# Patient Record
Sex: Male | Born: 1955 | Race: Black or African American | Hispanic: No | Marital: Married | Smoking: Never smoker
Health system: Southern US, Community
[De-identification: ages and names within clinical notes are randomized; demographics above are authoritative.]

## PROBLEM LIST (undated history)

## (undated) DIAGNOSIS — I483 Typical atrial flutter: Secondary | ICD-10-CM

## (undated) DIAGNOSIS — R55 Syncope and collapse: Secondary | ICD-10-CM

## (undated) DIAGNOSIS — Z950 Presence of cardiac pacemaker: Secondary | ICD-10-CM

## (undated) DIAGNOSIS — Q254 Congenital malformation of aorta unspecified: Secondary | ICD-10-CM

## (undated) DIAGNOSIS — I2699 Other pulmonary embolism without acute cor pulmonale: Secondary | ICD-10-CM

## (undated) DIAGNOSIS — I10 Essential (primary) hypertension: Secondary | ICD-10-CM

## (undated) DIAGNOSIS — C801 Malignant (primary) neoplasm, unspecified: Secondary | ICD-10-CM

## (undated) DIAGNOSIS — Z9289 Personal history of other medical treatment: Secondary | ICD-10-CM

## (undated) DIAGNOSIS — I442 Atrioventricular block, complete: Secondary | ICD-10-CM

## (undated) DIAGNOSIS — I440 Atrioventricular block, first degree: Secondary | ICD-10-CM

## (undated) HISTORY — DX: Syncope and collapse: R55

## (undated) HISTORY — DX: Personal history of other medical treatment: Z92.89

## (undated) HISTORY — DX: Typical atrial flutter: I48.3

## (undated) HISTORY — DX: Atrioventricular block, first degree: I44.0

---

## 2002-11-01 ENCOUNTER — Encounter: Payer: Self-pay | Admitting: Ophthalmology

## 2002-11-01 ENCOUNTER — Ambulatory Visit (HOSPITAL_COMMUNITY): Admission: RE | Admit: 2002-11-01 | Discharge: 2002-11-01 | Payer: Self-pay | Admitting: Ophthalmology

## 2007-12-26 ENCOUNTER — Inpatient Hospital Stay (HOSPITAL_COMMUNITY): Admission: EM | Admit: 2007-12-26 | Discharge: 2007-12-28 | Payer: Self-pay | Admitting: Emergency Medicine

## 2007-12-28 ENCOUNTER — Encounter (INDEPENDENT_AMBULATORY_CARE_PROVIDER_SITE_OTHER): Payer: Self-pay | Admitting: Cardiology

## 2008-01-25 ENCOUNTER — Ambulatory Visit: Payer: Self-pay | Admitting: Internal Medicine

## 2008-02-05 ENCOUNTER — Ambulatory Visit: Payer: Self-pay

## 2008-03-02 ENCOUNTER — Ambulatory Visit: Payer: Self-pay | Admitting: Internal Medicine

## 2008-03-02 LAB — CONVERTED CEMR LAB
BUN: 8 mg/dL (ref 6–23)
Basophils Absolute: 0 10*3/uL (ref 0.0–0.1)
Basophils Relative: 0.3 % (ref 0.0–1.0)
CO2: 32 meq/L (ref 19–32)
Calcium: 9 mg/dL (ref 8.4–10.5)
Chloride: 106 meq/L (ref 96–112)
Creatinine, Ser: 1.2 mg/dL (ref 0.4–1.5)
Eosinophils Absolute: 0 10*3/uL (ref 0.0–0.7)
Eosinophils Relative: 0.8 % (ref 0.0–5.0)
GFR calc Af Amer: 82 mL/min
GFR calc non Af Amer: 68 mL/min
Glucose, Bld: 101 mg/dL — ABNORMAL HIGH (ref 70–99)
HCT: 43.9 % (ref 39.0–52.0)
Hemoglobin: 14.2 g/dL (ref 13.0–17.0)
INR: 1.1 — ABNORMAL HIGH (ref 0.8–1.0)
Lymphocytes Relative: 56.8 % — ABNORMAL HIGH (ref 12.0–46.0)
MCHC: 32.4 g/dL (ref 30.0–36.0)
MCV: 90.9 fL (ref 78.0–100.0)
Monocytes Absolute: 0.3 10*3/uL (ref 0.1–1.0)
Monocytes Relative: 7.3 % (ref 3.0–12.0)
Neutro Abs: 1.4 10*3/uL (ref 1.4–7.7)
Neutrophils Relative %: 34.8 % — ABNORMAL LOW (ref 43.0–77.0)
Platelets: 300 10*3/uL (ref 150–400)
Potassium: 4.3 meq/L (ref 3.5–5.1)
Prothrombin Time: 13.2 s (ref 10.9–13.3)
RBC: 4.83 M/uL (ref 4.22–5.81)
RDW: 13.1 % (ref 11.5–14.6)
Sodium: 142 meq/L (ref 135–145)
WBC: 4.1 10*3/uL — ABNORMAL LOW (ref 4.5–10.5)
aPTT: 28.4 s (ref 21.7–29.8)

## 2008-03-11 ENCOUNTER — Ambulatory Visit: Payer: Self-pay | Admitting: Cardiology

## 2008-03-11 ENCOUNTER — Inpatient Hospital Stay (HOSPITAL_BASED_OUTPATIENT_CLINIC_OR_DEPARTMENT_OTHER): Admission: RE | Admit: 2008-03-11 | Discharge: 2008-03-11 | Payer: Self-pay | Admitting: Cardiology

## 2008-04-01 ENCOUNTER — Ambulatory Visit: Payer: Self-pay | Admitting: Internal Medicine

## 2009-07-14 ENCOUNTER — Emergency Department (HOSPITAL_COMMUNITY): Admission: EM | Admit: 2009-07-14 | Discharge: 2009-07-14 | Payer: Self-pay | Admitting: Emergency Medicine

## 2010-11-04 ENCOUNTER — Encounter: Payer: Self-pay | Admitting: Cardiovascular Disease

## 2011-02-26 NOTE — Letter (Signed)
January 25, 2008    Cameron Huerta, M.D.  [address]  [address]  [place], [place] [address]   RE:  Cameron Huerta, Cameron Huerta  MRN:  [id number]  /  DOB:  [birth year]   Dear Dr. Sharyn Huerta,   It was a pleasure to see your patient Cameron Huerta in consultation today  because of syncope.   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  He was at  [employer] with his wife, and he needed something from the cardiac, and he  sprinted to the car.  He.  He stopped at the car and became lightheaded  and then syncopal.  EMS was called.  They arrived about 20 minutes  later, as per the estimate of his wife.  Vital signs at that point were  not recalled.  He was taken by ambulance to [facility], where he was  found to be in atrial flutter.  He is unaware of palpitations.  I should  note that when he awakened on the ground he was a acutely aware of being  short of breath, although there were no palpitations.  The shortness of  breath had resolved by the time he got to the hospital.  The atrial  flutter resolve spontaneously at the time of micturition.   The patient had a previous episode of syncope about 10 years before.  The situation was quite similar.  He had sprinted across the track to  demonstrate to the track team how to run through.  He stopped, turned,  had the same prodrome of lightheadedness, and became syncopal.  He has  had no other episodes that he can recall.   Thromboembolic risk factors are negative.   FAMILY HISTORY:  Negative for syncope or sudden death.   PAST MEDICAL HISTORY:  Broadly negative.   PAST SURGICAL HISTORY:  Negative.   REVIEW OF SYSTEMS:  Also broadly negative.   SOCIAL HISTORY:  As above.  He does not use cigarettes, alcohol, or  recreational drugs.  He does exercise and would like to continue to do  that.   Currently, he is taking red yeast rice and multivitamins.   He is not  allergic to latex.  He does have some upset stomach with  SHELLFISH, but no hive reaction.   PHYSICAL EXAMINATION:  GENERAL:  He is a middle-aged [place] American  male appearing his stated age of 34.  VITAL SIGNS:  His blood pressure is 128/74, his weight was 248.  His  heart rate was 101.  He was in no acute distress.  HEENT:  Demonstrated no icterus or xanthomata.  NECK:  Veins were flat.  His carotids were brisk and full bilaterally,  without bruits.  BACK:  Without kyphosis or scoliosis.  LUNGS:  Clear.  HEART:  Sounds were regular, without murmurs or gallops.  ABDOMEN:  Soft, with active bowel sounds, without midline pulsation or  hepatomegaly.  EXTREMITIES:  Femoral pulses were 2+.  Distal pulses were intact.  There  was no clubbing, cyanosis, or edema.  NEUROLOGIC:  Grossly normal.   Electrocardiograms were somewhat variable.  Today's electrocardiogram  demonstrated sinus rhythm at 101, with intervals of 0.24/0.09/0.33.  The  axis was 185 degrees.  The P-wave morphology was a little bit unusual  and was a little bit prominent in the positive deflection in lead V1.  The P-wave duration was least 120  msec in lead II.   Electrocardiogram from presentation at [facility] demonstrated atrial  flutter, which was typical, with an axis of 157.  Electrocardiogram  obtained later that day demonstrated sinus rhythm, with intervals of  0.26/0.09/0.35.  The axis about 150 degrees there as well, and the  electrocardiogram on December 27, 2007 demonstrated an RSR prime in lead  V1, the first-degree AV block was still evident at 270 msec.   Echocardiogram read by Dr. Sharyn Huerta as normal, with normal right-sided  volumes.   IMPRESSION:  1. Recurrent syncope, post exertional, with an interlude of 10 years.  2. Atrial flutter, with spontaneous termination.  3. Thromboembolic risk factors are negative, with a CHADS score of 0.  4. Abnormal electrocardiogram manifested by:      a.     RSR  prime.      b.     First degree AV block.      c.     Right axis deviation.   DISCUSSION:  Please the transcription school back up and prior the  problems insert   Dr. Sharyn Huerta, [name] has an unusual constellation of abnormalities  that beg a unifying diagnosis.  First and foremost, his atrial flutter,  and I think notwithstanding his CHADS score of 0, the thromboembolic  risk is clearly not 0, and the likelihood of recurrence is felt to be  very high, probably in the range of 90+% over the next year, and I think  it is reasonable to consider catheter ablation of his atrial flutter  substrate.  They are people who would argue that a second episode is  worth awaiting.  As the patient has no palpitations with his arrhythmia,  I am not sure that that offers [place] a lot in terms of certainty of  recurrence.  We discussed catheter ablation, with potential benefits as  well as potential risks, compared to thromboembolic risk in the absence  of therapy.  He will consider that with his wife and decide.   The second issue relates to his syncope.  I suspect it is neurally  mediated in a post-exertional phase.  In the absence of his  electrocardiographic abnormalities, I would attribute this to a post-  exertional vasodilatory phase.  However, his electrocardiographic  abnormalities beg further evaluation.  The differential would include an  ASD which might contribute to his atrial flutter, but I would have  expected some atriopathy to be evident on echocardiogram, Brugada  syndrome as suggested by the first-degree AV block and incomplete right  bundle branch block.  To that end, I would repeat electrocardiogram with  V1 and V2 put in the second intercostal space, as well as give him a  sodium challenge with flecainide 300 mg and look at serial  electrocardiograms.   Prior to resuming workouts, I also think he should undergo stress  testing because the close relationship with exertion to his  syncopal  episodes suggests the possibility of a cardiomyopathic process giving  rise to syncope, although as I said above, the interlude of 10 years  makes that less likely.   Based on the above, which I reviewed extensively with the patient and  his wife, I recommend that:  1. We pursue Myoview scanning.  2. We consider catheter ablation.  3. We anticipate sodium channel challenge.  4. Which I did not review with them, but on further review I think it      is important to repeat his  echocardiogram with a bubble study to see if we can find evidence      of a primum ASD that might explain his electrocardiographic      abnormalities.   Thanks very much for the consultation.    Sincerely,      Duke Salvia, MD, [facility]  Electronically Signed    SCK/MedQ  DD: 01/25/2008  DT: 01/25/2008  Job #: [id number]   CC:    Scott A. Gerda Diss, MD

## 2011-02-26 NOTE — Discharge Summary (Signed)
NAME:  Nunley, Leanthony                  ACCOUNT NO.:  [id number]   MEDICAL RECORD NO.:  [id number]          PATIENT TYPE:  INP   LOCATION:  1430                         FACILITY:  [facility]   PHYSICIAN:  Mohan N. Sharyn Lull, M.D. DATE OF BIRTH:  May 30, 1956   DATE OF ADMISSION:  12/25/2007  DATE OF DISCHARGE:  12/28/2007                               DISCHARGE SUMMARY   ADMITTING DIAGNOSES:  1. Status post syncope.  2. Rule out cardiac arrhythmias, status post paroxysmal atrial      fibrillation.  3. Laceration of occipital region with small hematoma.  4. Morbid obesity.  5. Positive family history of coronary artery disease.   DISCHARGE DIAGNOSES:  1. Status post syncope.  2. Status post paroxysmal atrial fibrillation.  3. Resolving small hematoma with laceration of the occipital region.  4. Elevated blood pressure.  5. Glucose intolerance.  6. Hypercholesteremia.  7. Morbid obesity.  8. Positive family history of coronary artery disease.   DISCHARGE HOME MEDICATIONS:  1. Enteric-coated aspirin 325 mg one tablet daily.  2. Lopressor 25 mg half tablet twice daily.  3. Crestor 10 mg one tablet daily, which the patient refused to take.      We will discuss with his PMD before starting any medicines.  4. Keflex 500 mg one tablet every 8 hours for five days.   The patient will see his PMD for removal of staples in 1 week.   DIET:  Low-salt, low-cholesterol, 1800 calories ADA diet.  The patient  has been advised to avoid sweets.   FOLLOWUP:  Follow up with PMD and [facility] as per the  patient's wish in 1 week.   ACTIVITY:  Increase activities slowly.  Avoid any heavy exertion.   CONDITION ON DISCHARGE:  Stable.   BRIEF HISTORY AND HOSPITAL COURSE:  The patient is a 55 year old black  male with no significant past medical history except for family history  of coronary artery disease and morbid obesity.  He came to the ER via  EMS following syncopal episodes and sustaining  small laceration over the  occipital region associated with small hematoma.  The patient states  while running from the [employer] to parking lot car, he felt suddenly dizzy  and passed out for few seconds and hit his head.  Denies any palpitation  prior to fall, but felt lightheaded and dizzy prior to falling on back  of his head.  Denies any chest pain, nausea, vomiting, or diaphoresis  prior to falling.  Denies seizure activity.  Denies such episodes in the  past.  Denies any recent weight loss.  Denies any history of exertional  chest pain or exertional dyspnea.  Denies palpitation and  lightheadedness in the past.  Denies cough, fevers, or chills.  The  patient was noted to be in atrial fibrillation with controlled  ventricular response in the ER and then spontaneously converted to  normal sinus rhythm.   PAST MEDICAL HISTORY:  As above.   PAST SURGICAL HISTORY:  None.   ALLERGIES:  None.   MEDICATIONS:  None.   SOCIAL HISTORY:  Married and has five children.  No history of smoking  or alcohol abuse.  He works as a Financial risk analyst at [employer].   FAMILY HISTORY:  Father is alive.  He is 83.  He had myocardial  infarction x2.  Mother died of myocardial infarction at the age of 30.  One sister in good health.   PHYSICAL EXAMINATION:  GENERAL:  He was alert, awake, and oriented x3 in  no acute distress.  VITAL SIGNS:  Blood pressure was 117/76 and pulse was 76, regular.  HEENT:  Conjunctiva was pink.  Head:  There was small laceration in base  of scalp with staples.  NECK:  Supple.  No JVD.  No bruit.  LUNGS:  Clear to auscultation without rhonchi or rales.  CARDIOVASCULAR:  S1 and S2 was normal.  There was no S3 or S4 gallop.  ABDOMEN:  Soft.  Bowel sounds were present.  Nontender.  EXTREMITIES:  There is no clubbing, cyanosis, or edema.   LABORATORY DATA:  CT of the brain showed no evidence of bleed.  There  was a small occipital hematoma, and no  fracture.  Hemoglobin was 14.7,  hematocrit 44.2, and white count of 6.3.  Potassium was 4.2, BUN 10,  creatinine 1.23, and glucose was 103.  CPK-MBs were negative.  His  initial EKG showed left atrial fibrillation with controlled ventricular  response and left posterior fascicular block.  A repeat EKG showed sinus  rhythm with first degree AV block with right axis deviation.  His  cholesterol was 211, LDL of 152, and glucose was 108.  TSH was in normal  range 0.795.  Repeat fasting glucose was 101, hemoglobin A1c was 5.9,  potassium was 4.3, BUN 11, and creatinine 1.31.   BRIEF HOSPITAL COURSE:  The patient was admitted to telemetry unit.  The  patient did not have any episodes of atrial fibrillation during the  hospital stay or any cardiac arrhythmias.  The patient's blood pressure  and blood sugar remained slightly elevated, but the patient refused for  any medications.  The patient had 2D echo done today, which showed  mildly thickened left ventricular wall and upper limit of normal aortic  roots with good left ventricular systolic function.  Discussed at length  with the patient and his wife regarding Holter event monitor, a loop  recorder,  etc., and medications and diet.  He will discuss with his PMD, and will  follow up with [facility] as an outpatient.  The patient has  been advised to call the emergency medical services if he feels dizzy,  lightheaded, or palpitations.      Eduardo Osier. Sharyn Lull, M.D.  Electronically Signed     MNH/MEDQ  D:  12/28/2007  T:  12/28/2007  Job:  [id number]

## 2011-02-26 NOTE — H&P (Signed)
NAME:  Cameron Huerta, Cameron Huerta                  ACCOUNT NO.:  [id number]   MEDICAL RECORD NO.:  [id number]          PATIENT TYPE:  OIB   LOCATION:  1963                         FACILITY:  [facility]   PHYSICIAN:  Bruce R. Juanda Chance, MD, FACCDATE OF BIRTH:  [birth year]   DATE OF ADMISSION:  03/11/2008  DATE OF DISCHARGE:  03/11/2008                              HISTORY & PHYSICAL   PRIMARY CARDIOLOGIST:  Duke Salvia, MD, [facility]   PRIMARY CARE PHYSICIAN:  Eduardo Osier. Sharyn Lull, MD   HISTORY OF PRESENT ILLNESS:  This is a 55 year old African-American male  who is here for cardiac catheterization secondary to stress test that  was nondiagnostic.  The test was done secondary to a syncopal episode in  March for which he was hospitalized.  He saw Dr. Graciela Husbands as an outpatient  on followup secondary to this for workup for cardiac etiology for  syncope.  The patient subsequently had a stress Myoview completed which  was nondiagnostic.  The patient has been referred for an outpatient  cardiac catheterization which will be performed by Dr. Charlies Constable  today.  The patient did have a history induced onset atrial flutter  which was spontaneously terminated on his own and his EKG revealed  incomplete right bundle-branch block.  He had an echocardiogram prior to  this.  Stress test revealed an EF of 55%-60%.   The patient has had no further episodes of syncope, chest discomfort.  He has been very active walking and would like to start jogging in the  summer but is waiting for cardiac clearance to do so.   PAST MEDICAL HISTORY:  Negative for diabetes, hypertension,  hypothyroidism, or kidney disease.  The patient does have a known  history of contrast dye allergy.  The patient is not on any medications  prior to this admission with the exception of some red yeast rice and  multivitamins at home.   FAMILY HISTORY:  Negative for syncope or sudden death.   PAST SURGICAL HISTORY:  None.   SOCIAL HISTORY:  He is a  principal at a high school.  He does not smoke,  does not drink alcohol.  He exercises regularly.   CURRENT LABORATORY DATA:  Sodium 142, potassium 4.3, chloride 106, CO2  32, BUN 8, creatinine 1.2, glucose 101.  Hemoglobin 14.2, hematocrit  43.9, white blood cells 4.0, platelets 300.  PT 13.2, INR 1.1.   PHYSICAL EXAMINATION:  VITAL SIGNS:  Blood pressure 149/70, heart rate  97, respirations 20, temperature 97% on room air.  HEENT:  Head is normocephalic, atraumatic.  Eyes, PERRLA.  Mucous  membranes in mouth are pink and moist.  Tongue is midline.  NECK:  Supple without JVD or carotid bruits appreciated.  CARDIOVASCULAR:  Regular rate and rhythm with 1/6 systolic murmur  auscultated without rubs or gallops.  LUNGS:  Clear to auscultation without wheezes, rales, or rhonchi.  ABDOMEN:  Obese, nontender with 2+ bowel sounds.  EXTREMITIES:  Femoral pulses were 1+ bilaterally, radial pulses 1+  bilaterally, dorsalis pedis pulses 1+ bilaterally.  There is no edema  noted.  SKIN:  Warm and dry.  NEURO:  Intact.   IMPRESSION:  1. Syncopal episode, rule out cardiac etiology with nondiagnostic      stress test.  2. History of atrial flutter with spontaneous termination.   PLAN:  The patient will be admitted as an outpatient for cardiac  catheterization to be performed today per Dr. Charlies Constable, more  recommendations post procedure.  The patient has been given IV contrast  dye prophylaxis.  Risks and benefits have been explained to the patient  and also explanation of procedure.  He verbalizes understanding and is  willing to proceed.      Bettey Mare. Lyman Bishop, NP      Everardo Beals. Juanda Chance, MD, [facility]  Electronically Signed    KML/MEDQ  D:  03/11/2008  T:  03/12/2008  Job:  [id number]   cc:   Eduardo Osier. Sharyn Lull, M.D.

## 2011-02-26 NOTE — Cardiovascular Report (Signed)
NAME:  Cameron Huerta, Cameron Huerta                  ACCOUNT NO.:  [id number]   MEDICAL RECORD NO.:  [id number]          PATIENT TYPE:  OIB   LOCATION:  1963                         FACILITY:  [facility]   PHYSICIAN:  Bruce R. 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  He recently had a syncopal episode while running and  was seen in consultation by Dr. Graciela Husbands.  He had a stress test, which  suggested questionable apical ischemia and arrangements were made for  cardiac catheterization.  His ejection fraction was 53% on a stress  test.   PROCEDURE:  The procedure was performed via the right femoral artery and  arterial sheath and 4-French Performa coronary catheters.  A front wall  arterial puncture was performed, and Omnipaque contrast was used.  The  patient tolerated the procedure well and left the operating room in  satisfactory condition.   RESULTS:  The aortic pressure was 126/82 with a mean of 101, left  ventricular pressure was 126/15.  The left main coronary artery present in the left main coronary artery  was free of significant disease.  Left anterior descending artery present in the left anterior descending  artery gave rise 2 diagonal branch and 2 septal perforators.  These in  the LAD proper were free of significant disease.  The circumflex artery present in the circumflex artery gave rise to the  marginal branch and posterolateral branches.  These vessels were free of  significant disease.  The right coronary artery present in the right coronary artery was with  a moderate-sized vessel gave rise to a posterior descending branch and 2  small posterolateral branches.  These vessels were free of significant  disease.  The left ventriculogram present in the left ventriculogram was  performed  on RAO projection showed good wall motion with no areas of hypokinesis  with an ejection fraction of 60%.   CONCLUSION:  Normal coronary angiography in left ventricular motion.   RECOMMENDATION:  Reassurance.  Will have the patient see Dr. Graciela Husbands back  in followup for __________ further evaluation.      Bruce Elvera Lennox Juanda Chance, MD, [facility]  Electronically Signed     BRB/MEDQ  D:  03/11/2008  T:  03/12/2008  Job:  [id number]   cc:   Duke Salvia, MD, [facility]  Scott A. Gerda Diss, MD  Eduardo Osier Sharyn Lull, M.D.

## 2011-02-26 NOTE — Assessment & Plan Note (Signed)
[facility]                         ELECTROPHYSIOLOGY OFFICE NOTE   NAME:Cameron Huerta, Cameron Huerta                    MRN:          [id number]  DATE:04/01/2008                            DOB:          18-Jun-1956    Cameron Huerta is seen following the catheterization that was prompted by an  abnormal Myoview scan in the setting of syncope.  His catheterization  was normal.   He also had an RSR prime, a little bit of ST-elevation in the anterior  precordium, and the exclusion of Brugada is something that we have also  discussed.  Furthermore, when he presented to hospital in March, he was  in atrial flutter.  His [place] score is 0.   His current medications include only red yeast rice.  His blood pressure  was mildly elevated at 142/91, with the pulse of 85.  He says his blood  pressure is elevated because he hates coming here, although I should  note that his blood pressure when he was here last was 128/74.  His  lungs were clear.  His neck veins were flat.  His heart sounds were  regular.  The abdomen was Soft.  The extremities were without edema.   Electrocardiogram today demonstrated sinus rhythm at 80 with a rightward  axis, which is a little bit more rightward than it has been about 213.  The PR interval is 0.26, which is stable and this remains an R prime in  lead V1, though it is less evident than it was previously.   IMPRESSION:  1. Syncope, thought to be neurally mediated.  2. RSR prime in lead V1 with need to exclude Brugada syndrome.  3. Atrial flutter - typical.  4. [place] score 0.  5. Abnormal Myoview prompting catheterization, which was normal.  6. First-degree anteroventral block.    Cameron Huerta and his wife and I had a lengthy discussion regarding the  aforementioned issues.  The plan will be to proceed with a flutter  ablation for the potential reduction and thromboembolic risk associated  with a [place] score 0, which is estimated at 1.8%.  We  discussed potential  benefits as well as potential risks of the procedure including but not  limited to death and pacemaker implantation.  He would like to proceed.   In addition, at the time of that hospitalization, we will give him a  flecainide challenge to exclude as best as we can Brugada syndrome,  given the abnormal electrocardiogram.   The fact that he has conduction system disease adds to my concern about  Brugada syndrome not withstanding the episode.   He will let us know what works best for his schedule.     Duke Salvia, MD, [facility]  Electronically Signed    SCK/MedQ  DD: 04/01/2008  DT: 04/01/2008  Job #: [id number]   cc:   Dr.  Gerda Diss

## 2011-07-08 LAB — CARDIAC PANEL(CRET KIN+CKTOT+MB+TROPI)
CK, MB: 0.6
CK, MB: 0.9
Troponin I: 0.01

## 2011-07-08 LAB — COMPREHENSIVE METABOLIC PANEL
ALT: 27
AST: 26
Alkaline Phosphatase: 46
CO2: 30
Chloride: 104
GFR calc Af Amer: >60
GFR calc non Af Amer: >60
Glucose, Bld: 117 — ABNORMAL HIGH
Potassium: 4.6
Sodium: 139

## 2011-07-08 LAB — BASIC METABOLIC PANEL
BUN: 10
BUN: 8
BUN: 11
CO2: 31
CO2: 31
Calcium: 9.3
Chloride: 101
Chloride: 102
Chloride: 103
Creatinine, Ser: 1.21
Creatinine, Ser: 1.23
Creatinine, Ser: 1.31
GFR calc Af Amer: >60
Glucose, Bld: 108 — ABNORMAL HIGH
Glucose, Bld: 101 — ABNORMAL HIGH
Potassium: 4.2
Potassium: 4.3

## 2011-07-08 LAB — CBC
HCT: 40.3
HCT: 42.8
MCHC: 33.3
MCHC: 33.7
MCV: 87.6
MCV: 87.7
MCV: 87.7
Platelets: 239
Platelets: 251
Platelets: 246
RBC: 5.05
RDW: 13.8
RDW: 14
RDW: 14
WBC: 6.8
WBC: 5.5

## 2011-07-08 LAB — POCT CARDIAC MARKERS
Myoglobin, poc: 67
Operator id: 1192

## 2011-07-08 LAB — URINALYSIS, ROUTINE W REFLEX MICROSCOPIC
Bilirubin Urine: NEGATIVE
Ketones, ur: NEGATIVE
Nitrite: POSITIVE — AB
Specific Gravity, Urine: 1.021
Urobilinogen, UA: 0.2
pH: 6

## 2011-07-08 LAB — DIFFERENTIAL
Basophils Absolute: 0
Basophils Relative: 0
Eosinophils Absolute: 0.1
Neutro Abs: 3.1
Neutrophils Relative %: 50

## 2011-07-08 LAB — PROTIME-INR
INR: 1
Prothrombin Time: 13.5

## 2011-07-08 LAB — LIPID PANEL
Cholesterol: 211 — ABNORMAL HIGH
LDL Cholesterol: 152 — ABNORMAL HIGH
Total CHOL/HDL Ratio: 5.4
Triglycerides: 98
VLDL: 20

## 2011-07-08 LAB — APTT: aPTT: 26

## 2011-07-08 LAB — HEMOGLOBIN A1C: Hgb A1c MFr Bld: 5.9

## 2011-07-08 LAB — TROPONIN I: Troponin I: 0.01

## 2012-06-12 ENCOUNTER — Encounter: Payer: Self-pay | Admitting: Cardiology

## 2012-06-12 ENCOUNTER — Ambulatory Visit (INDEPENDENT_AMBULATORY_CARE_PROVIDER_SITE_OTHER): Payer: BC Managed Care – PPO | Admitting: Internal Medicine

## 2012-06-12 ENCOUNTER — Encounter: Payer: Self-pay | Admitting: Internal Medicine

## 2012-06-12 VITALS — BP 148/97 | HR 85 | Ht 74.0 in | Wt 251.8 lb

## 2012-06-12 DIAGNOSIS — E785 Hyperlipidemia, unspecified: Secondary | ICD-10-CM

## 2012-06-12 DIAGNOSIS — I4892 Unspecified atrial flutter: Secondary | ICD-10-CM

## 2012-06-12 NOTE — Progress Notes (Signed)
Patient has no care team.   HPI  Cameron Huerta is a 56 y.o. male Seen after a hiatus of a number of years because of lack of followup.  He was seen and found to have atrial flutter; he converted spontaneously. He has had no recurrent atrial flutter. Catheter ablation had been recommended in 2009 to decrease associated thromboembolic risk in the context of his CHADS2 score of 0  .    He has had no recurrent arrhythmia. He's had no recurrent syncope. The 2 episodes of syncope that he had were associated with brief running and stopping.  His CHADS-VASc score is 0 Past Medical History  Diagnosis Date  . Syncope   . Typical atrial flutter     [place] score - 0  . History of nuclear stress test     Abnormal  . First degree AV block   . Abnormal EKG     RSR prime in lead V1 with need to exclude Brugada Syndrome    No past surgical history on file.  Current Outpatient Prescriptions  Medication Sig Dispense Refill  . Multiple Vitamin (MULTI VITAMIN MENS PO) Take by mouth daily.        Not on File  Review of Systems negative except from HPI and PMH  Physical Exam BP 148/97  Pulse 85  Ht 6\' 2"  (1.88 m)  Wt 251 lb 12.8 oz (114.216 kg)  BMI 32.33 kg/m2 Alert and oriented in no acute distress HENT- normal Eyes- EOMI, without scleral icterus Skin- warm and dry; without rashes LN-neg Neck- supple without thyromegaly, JVP-flat, carotids brisk and full without bruits Back-without CVAT or kyphosis Lungs-clear to auscultation CV-Regular rate and rhythm, nl S1 and S2, no murmurs gallops or rubs, S4-absent Abd-soft with active bowel sounds; no midline pulsation or hepatomegaly Pulses-intact femoral and distal MKS-without gross deformity Neuro- Ax O, CN3-12 intact, grossly normal motor and sensory function Affect engaging  Electrocardiogram demonstrated sinus rhythm; is not available for review in epic  Assessment and  Plan

## 2012-06-14 DIAGNOSIS — I4892 Unspecified atrial flutter: Secondary | ICD-10-CM | POA: Insufficient documentation

## 2012-06-14 DIAGNOSIS — E785 Hyperlipidemia, unspecified: Secondary | ICD-10-CM | POA: Insufficient documentation

## 2012-06-14 NOTE — Assessment & Plan Note (Signed)
The patient has a Framingham risk score of a 10%. This puts him into the intermediate risk group for whom an LDL of 130-60 is appropriate for therapy depending on greater or less than 10%. I think for now it is reasonable to watch itan 10%

## 2012-06-14 NOTE — Assessment & Plan Note (Signed)
The patient has atrial flutter which has not recurred. His CHADS-VASc score is 0. Is not clear to me that any intervention is necessary. All catheter ablation could potentially eliminate that substrate of his atrial flutter marginal benefit at this point is measured in single digits per thousand and I have reviewed this with him. It was our agreed-upon decision to not do anything. We'll see him as needed for this appeared

## 2013-10-14 DIAGNOSIS — I2699 Other pulmonary embolism without acute cor pulmonale: Secondary | ICD-10-CM

## 2013-10-14 HISTORY — DX: Other pulmonary embolism without acute cor pulmonale: I26.99

## 2013-10-21 ENCOUNTER — Emergency Department (HOSPITAL_COMMUNITY): Payer: BC Managed Care – PPO

## 2013-10-21 ENCOUNTER — Inpatient Hospital Stay (HOSPITAL_COMMUNITY)
Admission: EM | Admit: 2013-10-21 | Discharge: 2013-10-25 | DRG: 176 | Disposition: A | Discharge status: Home / Self Care | Payer: BC Managed Care – PPO | Attending: Pulmonary Disease | Admitting: Pulmonary Disease

## 2013-10-21 ENCOUNTER — Encounter (HOSPITAL_COMMUNITY): Payer: Self-pay | Admitting: Emergency Medicine

## 2013-10-21 DIAGNOSIS — R55 Syncope and collapse: Secondary | ICD-10-CM | POA: Diagnosis not present

## 2013-10-21 DIAGNOSIS — I440 Atrioventricular block, first degree: Secondary | ICD-10-CM | POA: Diagnosis present

## 2013-10-21 DIAGNOSIS — Z8249 Family history of ischemic heart disease and other diseases of the circulatory system: Secondary | ICD-10-CM

## 2013-10-21 DIAGNOSIS — E785 Hyperlipidemia, unspecified: Secondary | ICD-10-CM

## 2013-10-21 DIAGNOSIS — R0902 Hypoxemia: Secondary | ICD-10-CM | POA: Diagnosis not present

## 2013-10-21 DIAGNOSIS — I498 Other specified cardiac arrhythmias: Secondary | ICD-10-CM | POA: Diagnosis present

## 2013-10-21 DIAGNOSIS — I4892 Unspecified atrial flutter: Secondary | ICD-10-CM | POA: Diagnosis present

## 2013-10-21 DIAGNOSIS — I441 Atrioventricular block, second degree: Secondary | ICD-10-CM

## 2013-10-21 DIAGNOSIS — I2699 Other pulmonary embolism without acute cor pulmonale: Principal | ICD-10-CM | POA: Diagnosis present

## 2013-10-21 DIAGNOSIS — R001 Bradycardia, unspecified: Secondary | ICD-10-CM

## 2013-10-21 HISTORY — DX: Other pulmonary embolism without acute cor pulmonale: I26.99

## 2013-10-21 LAB — BASIC METABOLIC PANEL
BUN: 14 mg/dL (ref 6–23)
CHLORIDE: 102 meq/L (ref 96–112)
CO2: 27 mEq/L (ref 19–32)
Calcium: 9.3 mg/dL (ref 8.4–10.5)
Creatinine, Ser: 1.15 mg/dL (ref 0.50–1.35)
GFR calc non Af Amer: 69 mL/min — ABNORMAL LOW (ref >90)
GFR, EST AFRICAN AMERICAN: 80 mL/min — AB (ref >90)
GLUCOSE: 101 mg/dL — AB (ref 70–99)
POTASSIUM: 4.7 meq/L (ref 3.7–5.3)
Sodium: 142 mEq/L (ref 137–147)

## 2013-10-21 LAB — POCT I-STAT 3, ART BLOOD GAS (G3+)
Bicarbonate: 25.3 mEq/L — ABNORMAL HIGH (ref 20.0–24.0)
O2 SAT: 95 %
PO2 ART: 76 mmHg — AB (ref 80.0–100.0)
TCO2: 26 mmol/L (ref 0–100)
pCO2 arterial: 40.7 mmHg (ref 35.0–45.0)
pH, Arterial: 7.401 (ref 7.350–7.450)

## 2013-10-21 LAB — HOMOCYSTEINE: HOMOCYSTEINE-NORM: 9.6 umol/L (ref 4.0–15.4)

## 2013-10-21 LAB — URINALYSIS, ROUTINE W REFLEX MICROSCOPIC
Bilirubin Urine: NEGATIVE
GLUCOSE, UA: NEGATIVE mg/dL
KETONES UR: 15 mg/dL — AB
Leukocytes, UA: NEGATIVE
NITRITE: NEGATIVE
PH: 7.5 (ref 5.0–8.0)
PROTEIN: NEGATIVE mg/dL
Specific Gravity, Urine: 1.015 (ref 1.005–1.030)
Urobilinogen, UA: 1 mg/dL (ref 0.0–1.0)

## 2013-10-21 LAB — COMPREHENSIVE METABOLIC PANEL
ALK PHOS: 61 U/L (ref 39–117)
ALT: 16 U/L (ref 0–53)
AST: 17 U/L (ref 0–37)
Albumin: 3.3 g/dL — ABNORMAL LOW (ref 3.5–5.2)
BUN: 11 mg/dL (ref 6–23)
CO2: 27 mEq/L (ref 19–32)
Calcium: 8.3 mg/dL — ABNORMAL LOW (ref 8.4–10.5)
Chloride: 102 mEq/L (ref 96–112)
Creatinine, Ser: 1.14 mg/dL (ref 0.50–1.35)
GFR calc non Af Amer: 70 mL/min — ABNORMAL LOW (ref >90)
GFR, EST AFRICAN AMERICAN: 81 mL/min — AB (ref >90)
GLUCOSE: 106 mg/dL — AB (ref 70–99)
POTASSIUM: 4.3 meq/L (ref 3.7–5.3)
Sodium: 140 mEq/L (ref 137–147)
TOTAL PROTEIN: 6.8 g/dL (ref 6.0–8.3)
Total Bilirubin: 0.9 mg/dL (ref 0.3–1.2)

## 2013-10-21 LAB — CBC WITH DIFFERENTIAL/PLATELET
BASOS PCT: 0 % (ref 0–1)
Basophils Absolute: 0 10*3/uL (ref 0.0–0.1)
Eosinophils Absolute: 0.1 10*3/uL (ref 0.0–0.7)
Eosinophils Relative: 1 % (ref 0–5)
HCT: 45.9 % (ref 39.0–52.0)
HEMOGLOBIN: 15.2 g/dL (ref 13.0–17.0)
LYMPHS ABS: 1.8 10*3/uL (ref 0.7–4.0)
Lymphocytes Relative: 32 % (ref 12–46)
MCH: 29 pg (ref 26.0–34.0)
MCHC: 33.1 g/dL (ref 30.0–36.0)
MCV: 87.4 fL (ref 78.0–100.0)
MONOS PCT: 8 % (ref 3–12)
Monocytes Absolute: 0.4 10*3/uL (ref 0.1–1.0)
NEUTROS ABS: 3.2 10*3/uL (ref 1.7–7.7)
NEUTROS PCT: 59 % (ref 43–77)
Platelets: 204 10*3/uL (ref 150–400)
RBC: 5.25 MIL/uL (ref 4.22–5.81)
RDW: 13.4 % (ref 11.5–15.5)
WBC: 5.5 10*3/uL (ref 4.0–10.5)

## 2013-10-21 LAB — URINE MICROSCOPIC-ADD ON

## 2013-10-21 LAB — CBC
HEMATOCRIT: 44.9 % (ref 39.0–52.0)
HEMOGLOBIN: 15.2 g/dL (ref 13.0–17.0)
MCH: 30 pg (ref 26.0–34.0)
MCHC: 33.9 g/dL (ref 30.0–36.0)
MCV: 88.7 fL (ref 78.0–100.0)
Platelets: 165 10*3/uL (ref 150–400)
RBC: 5.06 MIL/uL (ref 4.22–5.81)
RDW: 13.7 % (ref 11.5–15.5)
WBC: 11 10*3/uL — ABNORMAL HIGH (ref 4.0–10.5)

## 2013-10-21 LAB — TROPONIN I
Troponin I: <0.3 ng/mL (ref <0.30)
Troponin I: <0.3 ng/mL (ref <0.30)

## 2013-10-21 LAB — PROTIME-INR
INR: 1.71 — ABNORMAL HIGH (ref 0.00–1.49)
Prothrombin Time: 19.6 seconds — ABNORMAL HIGH (ref 11.6–15.2)

## 2013-10-21 LAB — MAGNESIUM: MAGNESIUM: 1.6 mg/dL (ref 1.5–2.5)

## 2013-10-21 LAB — ANTITHROMBIN III: AntiThromb III Func: 86 % (ref 75–120)

## 2013-10-21 LAB — PRO B NATRIURETIC PEPTIDE: Pro B Natriuretic peptide (BNP): 155.5 pg/mL — ABNORMAL HIGH (ref 0–125)

## 2013-10-21 LAB — MRSA PCR SCREENING: MRSA by PCR: NEGATIVE

## 2013-10-21 LAB — D-DIMER, QUANTITATIVE (NOT AT ARMC): D DIMER QUANT: 7.3 ug{FEU}/mL — AB (ref 0.00–0.48)

## 2013-10-21 LAB — GLUCOSE, CAPILLARY: Glucose-Capillary: 80 mg/dL (ref 70–99)

## 2013-10-21 LAB — PHOSPHORUS: Phosphorus: 3.3 mg/dL (ref 2.3–4.6)

## 2013-10-21 LAB — APTT: aPTT: 73 seconds — ABNORMAL HIGH (ref 24–37)

## 2013-10-21 MED ORDER — MORPHINE SULFATE 2 MG/ML IJ SOLN: 2.0000 mg | INTRAMUSCULAR | Status: DC | PRN

## 2013-10-21 MED ORDER — HEPARIN BOLUS VIA INFUSION
6000.0000 [IU] | Freq: Once | INTRAVENOUS | Status: AC
  Administered 2013-10-21: 6000 [IU] via INTRAVENOUS
  Filled 2013-10-21: qty 6000

## 2013-10-21 MED ORDER — SODIUM CHLORIDE 0.9 % IV SOLN: 250.0000 mL | INTRAVENOUS | Status: DC | PRN

## 2013-10-21 MED ORDER — ATROPINE SULFATE 0.1 MG/ML IJ SOLN
1.0000 mg | Freq: Once | INTRAMUSCULAR | Status: AC
  Administered 2013-10-21: 1 mg via INTRAVENOUS

## 2013-10-21 MED ORDER — ASPIRIN 81 MG PO CHEW
324.0000 mg | CHEWABLE_TABLET | Freq: Once | ORAL | Status: AC
  Administered 2013-10-21: 324 mg via ORAL
  Filled 2013-10-21: qty 4

## 2013-10-21 MED ORDER — OXYCODONE HCL 5 MG PO TABS: 5.0000 mg | ORAL_TABLET | ORAL | Status: DC | PRN

## 2013-10-21 MED ORDER — ASPIRIN 325 MG PO TABS: 325.0000 mg | ORAL_TABLET | Freq: Once | ORAL | Status: DC

## 2013-10-21 MED ORDER — ATROPINE SULFATE 1 MG/ML IJ SOLN: 1.0000 mg | Freq: Once | INTRAMUSCULAR | Status: DC

## 2013-10-21 MED ORDER — TRAMADOL HCL 50 MG PO TABS
50.0000 mg | ORAL_TABLET | Freq: Four times a day (QID) | ORAL | Status: DC | PRN
  Administered 2013-10-23 – 2013-10-24 (×2): 50 mg via ORAL
  Filled 2013-10-21 (×2): qty 1

## 2013-10-21 MED ORDER — SODIUM CHLORIDE 0.9 % IV SOLN
250.0000 mL | Freq: Once | INTRAVENOUS | Status: AC
  Administered 2013-10-21: 250 mL via INTRAVENOUS

## 2013-10-21 MED ORDER — SODIUM CHLORIDE 0.9 % IV BOLUS (SEPSIS): 2000.0000 mL | Freq: Once | INTRAVENOUS | Status: DC

## 2013-10-21 MED ORDER — HEPARIN (PORCINE) IN NACL 100-0.45 UNIT/ML-% IJ SOLN
1450.0000 [IU]/h | INTRAMUSCULAR | Status: DC
  Administered 2013-10-21: 1450 [IU]/h via INTRAVENOUS
  Filled 2013-10-21 (×4): qty 250

## 2013-10-21 MED ORDER — HEPARIN (PORCINE) IN NACL 100-0.45 UNIT/ML-% IJ SOLN
1600.0000 [IU]/h | INTRAMUSCULAR | Status: DC
  Administered 2013-10-21: 1600 [IU]/h via INTRAVENOUS
  Filled 2013-10-21 (×2): qty 250

## 2013-10-21 MED ORDER — SODIUM CHLORIDE 0.9 % IJ SOLN
3.0000 mL | Freq: Two times a day (BID) | INTRAMUSCULAR | Status: DC
  Administered 2013-10-21 – 2013-10-22 (×2): 3 mL via INTRAVENOUS

## 2013-10-21 MED ORDER — IOHEXOL 350 MG/ML SOLN
100.0000 mL | Freq: Once | INTRAVENOUS | Status: AC | PRN
  Administered 2013-10-21: 100 mL via INTRAVENOUS

## 2013-10-21 MED ORDER — MORPHINE SULFATE 4 MG/ML IJ SOLN
4.0000 mg | Freq: Once | INTRAMUSCULAR | Status: AC
  Administered 2013-10-21: 4 mg via INTRAVENOUS
  Filled 2013-10-21: qty 1

## 2013-10-21 MED ORDER — ASPIRIN 300 MG RE SUPP
300.0000 mg | RECTAL | Status: AC
  Filled 2013-10-21: qty 1

## 2013-10-21 MED ORDER — ONDANSETRON HCL 4 MG/2ML IJ SOLN: 4.0000 mg | Freq: Four times a day (QID) | INTRAMUSCULAR | Status: DC | PRN

## 2013-10-21 MED ORDER — ASPIRIN 81 MG PO CHEW
324.0000 mg | CHEWABLE_TABLET | ORAL | Status: AC
  Administered 2013-10-21: 324 mg via ORAL
  Filled 2013-10-21: qty 4

## 2013-10-21 MED ORDER — ALTEPLASE (PULMONARY EMBOLISM) INFUSION
100.0000 mg | Freq: Once | INTRAVENOUS | Status: AC
  Administered 2013-10-21: 100 mg via INTRAVENOUS
  Filled 2013-10-21: qty 100

## 2013-10-21 MED ORDER — ONDANSETRON HCL 4 MG/2ML IJ SOLN
4.0000 mg | Freq: Once | INTRAMUSCULAR | Status: AC
  Administered 2013-10-21: 4 mg via INTRAVENOUS

## 2013-10-21 MED ORDER — ONDANSETRON HCL 4 MG PO TABS: 4.0000 mg | ORAL_TABLET | Freq: Four times a day (QID) | ORAL | Status: DC | PRN

## 2013-10-21 MED ORDER — SODIUM CHLORIDE 0.9 % IV SOLN
INTRAVENOUS | Status: DC
  Administered 2013-10-21: 18:00:00 via INTRAVENOUS

## 2013-10-21 NOTE — ED Notes (Signed)
PT placed on 100% non rebreather. Because O2 sats in 80"s . Pt moved to [facility].

## 2013-10-21 NOTE — Progress Notes (Signed)
Requested by nursing secretary for pt who was coding in ED with family outside room. Chaplain presented to family in ED, but family member said she "is fine and does not need a chaplain." Please page if spiritual support is requested or needed at a later time.   Ful[place], [place]

## 2013-10-21 NOTE — ED Notes (Signed)
Pt c/o L sided chest pain that increases with inspiration x 3 days and chills/headache since last night.

## 2013-10-21 NOTE — ED Notes (Signed)
Patient transported to CT 

## 2013-10-21 NOTE — Progress Notes (Signed)
ANTICOAGULATION CONSULT NOTE - Initial Consult  Pharmacy Consult for Heparin Indication: pulmonary embolus  No Known Allergies  Patient Measurements: Height: 6\' 2"  (188 cm) Weight: 240 lb (108.863 kg) IBW/kg (Calculated) : 82.2 Heparin Dosing Weight: 104 kg  Vital Signs: Temp: 98.7 F (37.1 C) (01/08 0726) Temp src: Oral (01/08 0726) BP: 115/77 mmHg (01/08 0900) Pulse Rate: 74 (01/08 0900)  Labs:  Recent Labs  10/21/13 0800  HGB 15.2  HCT 45.9  PLT 204  CREATININE 1.15  TROPONINI <0.30    Estimated Creatinine Clearance: 93.1 ml/min (by C-G formula based on Cr of 1.15).   Medical History: Past Medical History  Diagnosis Date  . Syncope   . Typical atrial flutter     Mali score - 0  . History of nuclear stress test     Abnormal  . First degree AV block   . Abnormal EKG     RSR prime in lead V1 with need to exclude Brugada Syndrome    Medications:  PTA: MVI  Assessment: 58 y.o. male presents with CP. Found to have b/l PE on CT scan. To begin heparin. Baseline CBC stable.  1/8 CT scan: extensive bilateral acute pulmonary thromboembolism. Thereis nearly occlusive thrombus in the distal right pulmonary arteryextending into all 3 lobar vessels as well as segmental branches.There is also nonocclusive filling defect in the distal left pulmonary artery extending into left upper and lower lobe branches. No evidence of right heart strain based on the right ventricle to left ventricle ratio.  Goal of Therapy:  Heparin level 0.3-0.7 units/ml Monitor platelets by anticoagulation protocol: Yes   Plan:  1. Heparin IV bolus 6000 units 2. Heparin gtt at 1600 units/hr 3. Will f/u 6 hour heparin level 4. Daily heparin level and CBC  Sherlon Handing, PharmD, BCPS Clinical pharmacist, pager [phone] 10/21/2013,9:53 AM

## 2013-10-21 NOTE — ED Notes (Signed)
Pt wife calling for help in rm-- upon arrival into room-- pt with profuse diaphoresis, pale, heart rate in 40s. Dr. Tawnya Crook notified.

## 2013-10-21 NOTE — H&P (Signed)
Name: Cameron Huerta MRN: [id number] DOB: March 28, 1956    ADMISSION DATE:  10/21/2013 CONSULTATION DATE: 10/21/13    REFERRING MD :  [facility]  PRIMARY SERVICE: PCCM  CHIEF COMPLAINT:  sob  BRIEF PATIENT DESCRIPTION:  58 yo admitted with massive bilateral PE, given heparin gtt in ED, had subsequent bradycardia requiring atropine, Hep gtt held and tPA protocol initiated, did not require intubation  SIGNIFICANT EVENTS / STUDIES:  1/8 CTA with bilateral PE --> Hep gtt 1/8 bradycardia requiring Atropine 1/8 tPA protocol  LINES / TUBES: PIV  CULTURES: 1/8 UCx  ANTIBIOTICS: None  HISTORY OF PRESENT ILLNESS:  58 yo male with hx significant for paroxysmal atrial flutter previous evaluated by Cardiology on no anticoagulation, who presented to ED with complaints of shortness of breath and sharp non-radiating left sided chest pain on deep inspiration for prior 2-3 days. This was associated with productive cough, chills,low grade fever, bodyaches and headache. States wife had recent flu-like symptoms thus had attributed his condition to possible flu.  CXR unremarkable and EKG unchanged from prior.  D-dimer found to be elevated at 7.3 with subsequent CTA of chest demonstrating extensive bilateral pulmonary embolism with nearly occlusive thrombus in the  distal right pulmonary artery extending into all 3 lobar vessels as well as segmental branches. There was also nonocclusive filling defect in the distal left pulmonary artery extending into left upper and lower lobe branches.  He was started on IV Heparin with plan for admission given large amount of clot burden. Several hours later while still in ED pt's chest pain progressed from intermittent to constant on inspiration. He became diaphoretic,tachypneic, and bradycardic with heart rate in the 40's and O2 stas in the 80's. 1 amp atropine given with resolution of bradycardia.  He was placed on non-rebreather mask with improvement in oxygenation.  Of note, he  did vomit a large quantity of emesis. PCCM was consulted to assess for further management.    PAST MEDICAL HISTORY :  Past Medical History  Diagnosis Date  . Syncope   . Typical atrial flutter     Mali score - 0  . History of nuclear stress test     Abnormal  . First degree AV block   . Abnormal EKG     RSR prime in lead V1 with need to exclude Brugada Syndrome   History reviewed. No pertinent past surgical history. Prior to Admission medications   Medication Sig Start Date End Date Taking? Authorizing Provider  Multiple Vitamin (MULTI VITAMIN MENS PO) Take by mouth daily.   Yes Historical Provider, MD   Allergies  Allergen Reactions  . Scallops [Shellfish Allergy] Itching and Nausea And Vomiting    FAMILY HISTORY:  Family History  Problem Relation Age of Onset  . Hypertension Father   . Heart attack Father    SOCIAL HISTORY:  reports that he has never smoked. He has never used smokeless tobacco. He reports that he does not drink alcohol or use illicit drugs.  REVIEW OF SYSTEMS:  As per HPI otherwise negative   VITAL SIGNS: Temp:  [98.7 F (37.1 C)-99.8 F (37.7 C)] 99.8 F (37.7 C) (01/08 1715) Pulse Rate:  [44-96] 88 (01/08 1730) Resp:  [15-30] 24 (01/08 1730) BP: (57-141)/(33-88) 124/81 mmHg (01/08 1730) SpO2:  [92 %-100 %] 100 % (01/08 1730) FiO2 (%):  [100 %] 100 % (01/08 1529) Weight:  [240 lb (108.863 kg)] 240 lb (108.863 kg) (01/08 0726)    INTAKE / OUTPUT: Intake/Output  01/07 0701 - 01/08 0700 01/08 0701 - 01/09 0700   I.V. (mL/kg)  2000 (18.4)   Total Intake(mL/kg)  2000 (18.4)   Net   +2000          PHYSICAL EXAMINATION: General: Well-developed, well-nourished, on NRB; family at bedside Head: Normocephalic, atraumatic. Eyes: PERRLA, EOMI Lungs: Normal respiratory effort. Clear to auscultation bilaterally from apices to bases without crackles or wheezes appreciated. Heart: normal rate, regular rhythm, normal S1 and S2, no gallop, murmur,  or rubs appreciated. Abdomen: BS normoactive. Soft, Nondistended, non-tender. No masses or organomegaly appreciated. Extremities: No pretibial edema, distal pulses intact Neurologic: lethargic, otherwise grossly non-focal   LABS: CBC  Recent Labs Lab 10/21/13 0800  WBC 5.5  HGB 15.2  HCT 45.9  PLT 204   Coag's No results found for this basename: APTT, INR,  in the last 168 hours  BMET  Recent Labs Lab 10/21/13 0800  NA 142  K 4.7  CL 102  CO2 27  BUN 14  CREATININE 1.15  GLUCOSE 101*   Electrolytes  Recent Labs Lab 10/21/13 0800  CALCIUM 9.3   Sepsis Markers No results found for this basename: LATICACIDVEN, PROCALCITON, O2SATVEN,  in the last 168 hours ABG No results found for this basename: PHART, PCO2ART, PO2ART,  in the last 168 hours Liver Enzymes No results found for this basename: AST, ALT, ALKPHOS, BILITOT, ALBUMIN,  in the last 168 hours Cardiac Enzymes  Recent Labs Lab 10/21/13 0800  TROPONINI <0.30   Glucose No results found for this basename: GLUCAP,  in the last 168 hours  Imaging Dg Chest 2 View  10/21/2013   CLINICAL DATA:  Chest pain, chills  EXAM: CHEST  2 VIEW  COMPARISON:  12/25/2007  FINDINGS: The heart size and mediastinal contours are within normal limits. Both lungs are clear. The visualized skeletal structures are unremarkable.  IMPRESSION: No active cardiopulmonary disease.   Electronically Signed   By: Skipper Cliche M.D.   On: 10/21/2013 08:31   Ct Angio Chest Pe W/cm &/or Wo Cm  10/21/2013   CLINICAL DATA:  Chest pain  EXAM: CT ANGIOGRAPHY CHEST WITH CONTRAST  TECHNIQUE: Multidetector CT imaging of the chest was performed using the standard protocol during bolus administration of intravenous contrast. Multiplanar CT image reconstructions including MIPs were obtained to evaluate the vascular anatomy.  CONTRAST:  131mL OMNIPAQUE IOHEXOL 350 MG/ML SOLN  COMPARISON:  None.  FINDINGS: There is extensive bilateral acute pulmonary  thromboembolism. There is nearly occlusive thrombus in the distal right pulmonary artery extending into all 3 lobar vessels as well as segmental branches. There is also nonocclusive filling defect in the distal left pulmonary artery extending into left upper and lower lobe branches. No evidence of right heart strain based on the right ventricle to left ventricle ratio.  Extensive calcification in the left anterior descending coronary artery.  No pericardial effusion.  No abnormal adenopathy.  Ground-glass opacities are present at the base of the lingula.  Cholelithiasis.  Review of the MIP images confirms the above findings.  IMPRESSION: Acute pulmonary thromboembolism.  Cholelithiasis.  Critical Value/emergent results were called by telephone at the time of interpretation on 10/21/2013 at 9:39 AM to Dr. Jinny Blossom, [facility] , who verbally acknowledged these results.   Electronically Signed   By: Maryclare Bean M.D.   On: 10/21/2013 09:39     CXR:1/8 no acute findings  ASSESSMENT / PLAN:  PULMONARY A: massive bilateral pulmonary embolism now on tpa, transitioned from NRB, oxygenating 100%  on 2L nasal canula currently P:   Cont to monitor Cont tPA protocol  CARDIOVASCULAR A: h/o paroxysmal atrial flutter, no cardioversion, no anti-coagulation P:  Consider Cardiology consult monitor on tele Trend EKG Trend troponins  RENAL A:  No issues P:   Monitor BMET  GASTROINTESTINAL A: no issues P:   Start Protonix IV SUP  HEMATOLOGIC A:  massive PE s/p Hep gtt and tPA P:  tPA protocol initiated Cont to monitor  INFECTIOUS A: reports prior flu-like symptoms, no leukocytosis P:   Cont to monitor Check flu panel  ENDOCRINE A: no issues P:   Monitor   NEUROLOGIC A: lethargic but not encephalopathic P:   -cont to monitor   Family updated at length    Dorian Heckle, MD Internal Medicine Resident PGY 3  10/21/2013, 5:58 PM  Attending:  I have seen and examined the patient with nurse  practitioner/resident and agree with the note above.   CC time by me 49 minutes  Jillyn Hidden PCCM Pager: [phone] Cell: [phone] If no response, call [phone]

## 2013-10-21 NOTE — ED Notes (Signed)
Ordered lunch tray 

## 2013-10-21 NOTE — ED Notes (Addendum)
Report received, assumed care. Pt A&OX4. Denies SOB, worsening CP, nausea. POX 100% on NRB. VSS.

## 2013-10-21 NOTE — ED Notes (Signed)
Pt heart rate increasing-- not as diaphoretic, no further nausea.

## 2013-10-21 NOTE — ED Notes (Signed)
PCCM MD, Dr. Lake Bells at bedside.

## 2013-10-21 NOTE — Progress Notes (Signed)
[facility] for Heparin Indication: pulmonary embolus after receiving tPA  Allergies  Allergen Reactions  . Scallops [Shellfish Allergy] Itching and Nausea And Vomiting    Patient Measurements: Height: 6\' 2"  (188 cm) Weight: 240 lb (108.863 kg) IBW/kg (Calculated) : 82.2 Heparin Dosing Weight: 104 kg  Vital Signs: Temp: 98.6 F (37 C) (01/08 1807) Temp src: Oral (01/08 1807) BP: 146/89 mmHg (01/08 1800) Pulse Rate: 206 (01/08 1800)  Labs:  Recent Labs  10/21/13 0800 10/21/13 1900  HGB 15.2 15.2  HCT 45.9 44.9  PLT 204 165  APTT  --  73*  LABPROT  --  19.6*  INR  --  1.71*  CREATININE 1.15  --   TROPONINI <0.30  --     Estimated Creatinine Clearance: 93.1 ml/min (by C-G formula based on Cr of 1.15).   Assessment: 66 YOM who presented with CP and found to have B/L PE on CT scan. Started on heparin this morning. Early this afternoon, patient had syncope with near brady-arrest and required atropine. Heparin was held at 1500 this afternoon and tPA 100mg  IV over 2 hours was given starting at 1630. Labs 30 minutes post-tPA are as follows: INR = 1.71, aPTT = 73 seconds, Hgb = 15.2, Platelets 165. No bleeding is noted.  Goal of Therapy:  Heparin level 0.3-0.5 units/ml until 1/9 at 2100, after that the goal becomes 0.3-0.7 units/mL for the rest of heparin drip duration Monitor platelets by anticoagulation protocol: Yes   Plan:  1. Heparin can be resumed as aPTT is <80 seconds. Will resume heparin WITHOUT bolus at 1450 units/hr (14units/kg/hr) 2. Check heparin level in 6 hours- note goal above 3. Daily heparin level and CBC 4. Follow for long-term anticoagulant plans, s/s bleeding  Sakeena Teall D. Jonahtan Manseau, PharmD, BCPS Clinical Pharmacist Pager: [phone] 10/21/2013 8:13 PM

## 2013-10-21 NOTE — ED Notes (Signed)
C/o intermittent left lateral CP, prod cough x 2 days. Last night c/o chills, low grade fever, generalized body aches. Reports has noticed past 2 days gets SOB when he gets to top of stairs at home when he usually does not get SOB. No pedal edema. Reports deep breaths & laying on his left side makes pain worse. C/o pain only with deep breaths. Denies increase pain with exertion

## 2013-10-21 NOTE — ED Provider Notes (Addendum)
CSN: [id number]     Arrival date & time 10/21/13  3875 History   First MD Initiated Contact with Patient 10/21/13 [id number]     Chief Complaint  Patient presents with  . Chest Pain  . Chills   (Consider location/radiation/quality/duration/timing/severity/associated sxs/prior Treatment) Patient is a 58 y.o. male presenting with chest pain. The history is provided by the patient. No language interpreter was used.  Chest Pain Pain location:  L chest Pain quality: sharp   Pain radiates to:  Does not radiate Pain radiates to the back: no   Pain severity:  Moderate Onset quality:  Sudden Duration: 2-3 days. Timing:  Intermittent Progression:  Waxing and waning Chronicity:  New Context: breathing   Relieved by:  Nothing Worsened by:  Deep breathing Ineffective treatments:  None tried Associated symptoms: cough, fever and shortness of breath   Associated symptoms: no abdominal pain, no anorexia, no back pain, no dizziness, no dysphagia, no fatigue, no headache, no lower extremity edema, no nausea, no near-syncope, no numbness, no palpitations, no syncope, not vomiting and no weakness   Associated symptoms comment:  Chills, mylagias Cough:    Cough characteristics:  Productive   Sputum characteristics:  Brown   Severity:  Moderate   Cough duration: 2-3 days.   Timing:  Constant   Progression:  Unchanged   Chronicity:  New Fever:    Duration:  1 day   Temp source:  Subjective Risk factors: male sex   Risk factors: no aortic disease, no birth control, no coronary artery disease, no diabetes mellitus, no Ehlers-Danlos syndrome, no high cholesterol, no hypertension, no immobilization, no Marfan's syndrome, not obese, not pregnant, no prior DVT/PE, no smoking and no surgery     Past Medical History  Diagnosis Date  . Syncope   . Typical atrial flutter     Mali score - 0  . History of nuclear stress test     Abnormal  . First degree AV block   . Abnormal EKG     RSR prime in lead V1  with need to exclude Brugada Syndrome   History reviewed. No pertinent past surgical history. Family History  Problem Relation Age of Onset  . Hypertension Father   . Heart attack Father    History  Substance Use Topics  . Smoking status: Never Smoker   . Smokeless tobacco: Never Used  . Alcohol Use: No    Review of Systems  Constitutional: Positive for fever. Negative for activity change, appetite change and fatigue.  HENT: Negative for congestion, facial swelling, rhinorrhea and trouble swallowing.   Eyes: Negative for photophobia and pain.  Respiratory: Positive for cough and shortness of breath. Negative for chest tightness.   Cardiovascular: Positive for chest pain. Negative for palpitations, leg swelling, syncope and near-syncope.  Gastrointestinal: Negative for nausea, vomiting, abdominal pain, diarrhea, constipation and anorexia.  Endocrine: Negative for polydipsia and polyuria.  Genitourinary: Negative for dysuria, urgency, decreased urine volume and difficulty urinating.  Musculoskeletal: Negative for back pain and gait problem.  Skin: Negative for color change, rash and wound.  Allergic/Immunologic: Negative for immunocompromised state.  Neurological: Negative for dizziness, facial asymmetry, speech difficulty, weakness, numbness and headaches.  Psychiatric/Behavioral: Negative for confusion, decreased concentration and agitation.    Allergies  Scallops  Home Medications   Current Outpatient Rx  Name  Route  Sig  Dispense  Refill  . Multiple Vitamin (MULTI VITAMIN MENS PO)   Oral   Take by mouth daily.  BP 115/77  Pulse 74  Temp(Src) 98.7 F (37.1 C) (Oral)  Resp 21  Ht 6\' 2"  (1.88 m)  Wt 240 lb (108.863 kg)  BMI 30.80 kg/m2  SpO2 96% Physical Exam  Constitutional: He is oriented to person, place, and time. He appears well-developed and well-nourished. No distress.  HENT:  Head: Normocephalic and atraumatic.  Mouth/Throat: No oropharyngeal  exudate.  Eyes: Pupils are equal, round, and reactive to light.  Neck: Normal range of motion. Neck supple.  Cardiovascular: Normal rate, regular rhythm and normal heart sounds.  Exam reveals no gallop and no friction rub.   No murmur heard. Pulmonary/Chest: Effort normal and breath sounds normal. No respiratory distress. He has no wheezes. He has no rales.  Abdominal: Soft. Bowel sounds are normal. He exhibits no distension and no mass. There is no tenderness. There is no rebound and no guarding.  Musculoskeletal: Normal range of motion. He exhibits no edema and no tenderness.  Neurological: He is alert and oriented to person, place, and time.  Skin: Skin is warm and dry.  Psychiatric: He has a normal mood and affect.    ED Course  Procedures (including critical care time) Labs Review Labs Reviewed  BASIC METABOLIC PANEL - Abnormal; Notable for the following:    Glucose, Bld 101 (*)    GFR calc non Af Amer 69 (*)    GFR calc Af Amer 80 (*)    All other components within normal limits  D-DIMER, QUANTITATIVE - Abnormal; Notable for the following:    D-Dimer, Quant 7.30 (*)    All other components within normal limits  CBC WITH DIFFERENTIAL  TROPONIN I  HEPARIN LEVEL (UNFRACTIONATED)   Imaging Review Dg Chest 2 View  10/21/2013   CLINICAL DATA:  Chest pain, chills  EXAM: CHEST  2 VIEW  COMPARISON:  12/25/2007  FINDINGS: The heart size and mediastinal contours are within normal limits. Both lungs are clear. The visualized skeletal structures are unremarkable.  IMPRESSION: No active cardiopulmonary disease.   Electronically Signed   By: Skipper Cliche M.D.   On: 10/21/2013 08:31   Ct Angio Chest Pe W/cm &/or Wo Cm  10/21/2013   CLINICAL DATA:  Chest pain  EXAM: CT ANGIOGRAPHY CHEST WITH CONTRAST  TECHNIQUE: Multidetector CT imaging of the chest was performed using the standard protocol during bolus administration of intravenous contrast. Multiplanar CT image reconstructions including  MIPs were obtained to evaluate the vascular anatomy.  CONTRAST:  13mL OMNIPAQUE IOHEXOL 350 MG/ML SOLN  COMPARISON:  None.  FINDINGS: There is extensive bilateral acute pulmonary thromboembolism. There is nearly occlusive thrombus in the distal right pulmonary artery extending into all 3 lobar vessels as well as segmental branches. There is also nonocclusive filling defect in the distal left pulmonary artery extending into left upper and lower lobe branches. No evidence of right heart strain based on the right ventricle to left ventricle ratio.  Extensive calcification in the left anterior descending coronary artery.  No pericardial effusion.  No abnormal adenopathy.  Ground-glass opacities are present at the base of the lingula.  Cholelithiasis.  Review of the MIP images confirms the above findings.  IMPRESSION: Acute pulmonary thromboembolism.  Cholelithiasis.  Critical Value/emergent results were called by telephone at the time of interpretation on 10/21/2013 at 9:39 AM to Dr. Jinny Blossom, [facility] , who verbally acknowledged these results.   Electronically Signed   By: Maryclare Bean M.D.   On: 10/21/2013 09:39    EKG Interpretation    Date/Time:  Thursday October 21 2013 07:19:01 EST Ventricular Rate:  86 PR Interval:  272 QRS Duration: 78 QT Interval:  352 QTC Calculation: 421 R Axis:   -164 Text Interpretation:  Sinus rhythm with 1st degree A-V block Right superior axis deviation Abnormal ECG No significant change since last tracing Confirmed by [place] ([id number]) on 10/21/2013 7:31:12 AM          CRITICAL CARE Performed by: Ernestina Patches, E Total critical care time: 40 Critical care time was exclusive of separately billable procedures and treating other patients. Critical care was necessary to treat or prevent imminent or life-threatening deterioration. Critical care was time spent personally by me on the following activities: development of treatment plan with patient and/or surrogate  as well as nursing, discussions with consultants, evaluation of patient's response to treatment, examination of patient, obtaining history from patient or surrogate, ordering and performing treatments and interventions, ordering and review of laboratory studies, ordering and review of radiographic studies, pulse oximetry and re-evaluation of patient's condition.   MDM   1. Pulmonary embolism    Pt is a 58 y.o. male with Pmhx as above who presents with 2-3 days of inspiratory L sided CP, productive cough, also with subjective fever/chills since last night and spouse with recent influenza-like illness.  He also reports SOB w/ exertion.  No CP or SOB currently.  On PE, VSS, pt in NAD.  Cardiopulm exam benign.  No LE pain/edema. EKG unchanged from prior.  He had nml cath in 5/'09.  No hx or risk factors for ACS, or for PE other than age.  TIMI 0, low risk for MACE by HEART score.  CXR, CBC, BMP, Trop were unremarkable. D-dimer was elevated at 7.3.  CT PE study ordered and was + for extensive BL PE with nearly occlusive thrombus in the distal right pulmonary artery extending into all 3 lobar vessels as well as segmental branches. There is also nonocclusive filling defect in the distal left pulmonary artery extending into left upper and lower lobe branches.  No R heart strain.  Given large amt of clot burden, will admit to triad.  IV heparin ordered.   Neta Ehlers, MD 10/21/13 1044  3:15 PM Called to bedside. Pt diaphoretic, bradycardic in 40's, hypotensive as low as 56 systolic.  IV heparin temporarily halted to given IVF bolus while second IV placed, then 2nd L NS bolus started.  Pt began having emesis, continued to be bradycardic.  1mg  IV atropine given with improvement of BP & HR.  IV heparin restarted.  Pt remained to have nml mental status during episode.  States CP not worse than prior.  Denies SOB.  EKG repeated, was unchanged from prior.  Pt no longer appears to be appropriate floor candidate.   CCM consulted for admission and will see in ED.   3:35 PM Pt has now become hypoxic,  Requirement 15L NRB with O2 sats fluctuating greatly between 88-100.  Have recalled CCM as pt may now be good candidate for TPA given his worsening hemodynamics and hypoxia.  Critical care states Dr. Lake Bells will be there shortly to decide on lytics and admit pt to ICU.   Neta Ehlers, MD 10/22/13 1058

## 2013-10-21 NOTE — Progress Notes (Signed)
[facility] PCCM  Full note from resident to follow  Case reviewed, patient examined, CT angio images reviewed  Syncope, near brady-arrest earlier today requiring atropine  Given size of PE, HD instability, hypoxemia, we will move ahead with thrombolytics with TPA.  Contraindications reviewed with patient and wife, he has none.  Family informed of risks, benefits.    Plan TPA now per standard protocol; heparin on hold  Jillyn Hidden PCCM Pager: [phone] Cell: [phone] If no response, call [phone]

## 2013-10-21 NOTE — ED Notes (Signed)
Heart rate-- 40's-- Atropine 1 amp given per order.  Pt vomited large amt emesis.  Zofran 4mg  IV for nausea

## 2013-10-21 NOTE — ED Notes (Signed)
Patient transported to X-ray 

## 2013-10-21 NOTE — ED Notes (Signed)
Medicated for pain per order. States left lateral CP now with regular inspiration, therefore breathing shallow. Noted increased respiratory rate. States earlier CP occurred only with deep inspiration

## 2013-10-21 NOTE — Progress Notes (Signed)
58 year old man with prior h/o atrial flutter few years ago with spontaneous resolution, syncope, came in today for acute chest pain, he was worked up in ED , with a CT angio,w as found to have bilateral PE, was referred to [facility] for admission to medical service. Around 3 pm, pt became diaphoretic, went in to brady arrest requiring atropine. He was seen by PCCM, and transferred to PCCm service for further management .    Hosie Poisson, MD [phone]

## 2013-10-22 ENCOUNTER — Encounter (HOSPITAL_COMMUNITY): Payer: Self-pay | Admitting: Internal Medicine

## 2013-10-22 ENCOUNTER — Inpatient Hospital Stay (HOSPITAL_COMMUNITY): Payer: BC Managed Care – PPO

## 2013-10-22 DIAGNOSIS — I517 Cardiomegaly: Secondary | ICD-10-CM

## 2013-10-22 LAB — PROTEIN C, TOTAL: Protein C, Total: 73 % (ref 72–160)

## 2013-10-22 LAB — CBC
HCT: 39.3 % (ref 39.0–52.0)
HEMOGLOBIN: 13.2 g/dL (ref 13.0–17.0)
MCH: 29.8 pg (ref 26.0–34.0)
MCHC: 33.6 g/dL (ref 30.0–36.0)
MCV: 88.7 fL (ref 78.0–100.0)
Platelets: 158 10*3/uL (ref 150–400)
RBC: 4.43 MIL/uL (ref 4.22–5.81)
RDW: 13.6 % (ref 11.5–15.5)
WBC: 7.5 10*3/uL (ref 4.0–10.5)

## 2013-10-22 LAB — LUPUS ANTICOAGULANT PANEL
DRVVT: 36.4 secs (ref <42.9)
Lupus Anticoagulant: NOT DETECTED
PTT LA: 175.2 s — AB (ref 28.0–43.0)
PTTLA 4:1 Mix: 175.2 secs — ABNORMAL HIGH (ref 28.0–43.0)
PTTLA CONFIRMATION: 0.9 s (ref <8.0)

## 2013-10-22 LAB — CARDIOLIPIN ANTIBODIES, IGG, IGM, IGA
Anticardiolipin IgA: 10 APL U/mL — ABNORMAL LOW (ref <22)
Anticardiolipin IgG: 13 GPL U/mL (ref <23)
Anticardiolipin IgM: 4 MPL U/mL — ABNORMAL LOW (ref <11)

## 2013-10-22 LAB — BASIC METABOLIC PANEL
BUN: 12 mg/dL (ref 6–23)
CO2: 26 meq/L (ref 19–32)
CREATININE: 0.93 mg/dL (ref 0.50–1.35)
Calcium: 8.1 mg/dL — ABNORMAL LOW (ref 8.4–10.5)
Chloride: 103 mEq/L (ref 96–112)
GFR calc Af Amer: >90 mL/min (ref >90)
GFR calc non Af Amer: >90 mL/min (ref >90)
GLUCOSE: 99 mg/dL (ref 70–99)
Potassium: 4.1 mEq/L (ref 3.7–5.3)
SODIUM: 140 meq/L (ref 137–147)

## 2013-10-22 LAB — BETA-2-GLYCOPROTEIN I ABS, IGG/M/A
BETA-2-GLYCOPROTEIN I IGA: 8 A Units (ref <20)
BETA-2-GLYCOPROTEIN I IGM: 16 M Units (ref <20)
Beta-2 Glyco I IgG: 10 G Units (ref <20)

## 2013-10-22 LAB — FACTOR 5 LEIDEN

## 2013-10-22 LAB — PROTEIN S, TOTAL: PROTEIN S AG TOTAL: 66 % (ref 60–150)

## 2013-10-22 LAB — PROTEIN C ACTIVITY: PROTEIN C ACTIVITY: 119 % (ref 75–133)

## 2013-10-22 LAB — HEPARIN LEVEL (UNFRACTIONATED)
HEPARIN UNFRACTIONATED: 1.48 [IU]/mL — AB (ref 0.30–0.70)
HEPARIN UNFRACTIONATED: 0.5 [IU]/mL (ref 0.30–0.70)
Heparin Unfractionated: 0.41 IU/mL (ref 0.30–0.70)

## 2013-10-22 LAB — PROTHROMBIN GENE MUTATION

## 2013-10-22 LAB — PROTEIN S ACTIVITY: Protein S Activity: 82 % (ref 69–129)

## 2013-10-22 MED ORDER — PNEUMOCOCCAL VAC POLYVALENT 25 MCG/0.5ML IJ INJ
0.5000 mL | INJECTION | INTRAMUSCULAR | Status: AC
  Administered 2013-10-23: 0.5 mL via INTRAMUSCULAR
  Filled 2013-10-22: qty 0.5

## 2013-10-22 MED ORDER — RIVAROXABAN 20 MG PO TABS: 20.0000 mg | ORAL_TABLET | Freq: Every day | ORAL | Status: DC

## 2013-10-22 MED ORDER — RIVAROXABAN 15 MG PO TABS
15.0000 mg | ORAL_TABLET | Freq: Two times a day (BID) | ORAL | Status: DC
  Administered 2013-10-23 – 2013-10-25 (×5): 15 mg via ORAL
  Filled 2013-10-22 (×7): qty 1

## 2013-10-22 MED ORDER — PANTOPRAZOLE SODIUM 40 MG PO TBEC
40.0000 mg | DELAYED_RELEASE_TABLET | Freq: Every day | ORAL | Status: DC
  Administered 2013-10-22 – 2013-10-25 (×4): 40 mg via ORAL
  Filled 2013-10-22 (×4): qty 1

## 2013-10-22 MED ORDER — HEPARIN (PORCINE) IN NACL 100-0.45 UNIT/ML-% IJ SOLN
1450.0000 [IU]/h | INTRAMUSCULAR | Status: AC
  Administered 2013-10-22: 1450 [IU]/h via INTRAVENOUS
  Filled 2013-10-22: qty 250

## 2013-10-22 MED ORDER — SODIUM CHLORIDE 0.9 % IV SOLN
INTRAVENOUS | Status: DC
Start: 2013-10-22 — End: 2013-10-25
  Administered 2013-10-22 – 2013-10-24 (×4): via INTRAVENOUS

## 2013-10-22 MED FILL — Medication: Qty: 1 | Status: AC

## 2013-10-22 NOTE — Progress Notes (Signed)
Echocardiogram 2D Echocardiogram has been performed.  Abbee Cremeens 10/22/2013, 12:25 PM

## 2013-10-22 NOTE — Discharge Instructions (Signed)
Information on my medicine - XARELTO (rivaroxaban)  This medication education was reviewed with me or my healthcare representative as part of my discharge preparation.  The pharmacist that spoke with me during my hospital stay was:  Angelica Chessman, [place]? Xarelto was prescribed to treat blood clots that may have been found in the veins of your legs (deep vein thrombosis) or in your lungs (pulmonary embolism) and to reduce the risk of them occurring again.  What do you need to know about Xarelto? The starting dose is one 15 mg tablet taken TWICE daily with food for the FIRST 21 DAYS then on (enter date)  11/13/13  the dose is changed to one 20 mg tablet taken ONCE A DAY with your evening meal.  DO NOT stop taking Xarelto without talking to the health care provider who prescribed the medication.  Refill your prescription for 20 mg tablets before you run out.  After discharge, you should have regular check-up appointments with your healthcare provider that is prescribing your Xarelto.  In the future your dose may need to be changed if your kidney function changes by a significant amount.  What do you do if you miss a dose? If you are taking Xarelto TWICE DAILY and you miss a dose, take it as soon as you remember. You may take two 15 mg tablets (total 30 mg) at the same time then resume your regularly scheduled 15 mg twice daily the next day.  If you are taking Xarelto ONCE DAILY and you miss a dose, take it as soon as you remember on the same day then continue your regularly scheduled once daily regimen the next day. Do not take two doses of Xarelto at the same time.   Important Safety Information Xarelto is a blood thinner medicine that can cause bleeding. You should call your healthcare provider right away if you experience any of the following:   Bleeding from an injury or your nose that does not stop.   Unusual colored urine (red or dark brown) or  unusual colored stools (red or black).   Unusual bruising for unknown reasons.   A serious fall or if you hit your head (even if there is no bleeding).  Some medicines may interact with Xarelto and might increase your risk of bleeding while on Xarelto. To help avoid this, consult your healthcare provider or pharmacist prior to using any new prescription or non-prescription medications, including herbals, vitamins, non-steroidal anti-inflammatory drugs (NSAIDs) and supplements.  This website has more information on Xarelto: [email or web address]

## 2013-10-22 NOTE — Progress Notes (Signed)
ANTICOAGULATION CONSULT NOTE - Follow Up Consult  Pharmacy Consult for Heparin  Indication: pulmonary embolus, s/p tPA  Allergies  Allergen Reactions  . Scallops [Shellfish Allergy] Itching and Nausea And Vomiting   Patient Measurements: Height: 6\' 2"  (188 cm) Weight: 248 lb 0.3 oz (112.5 kg) IBW/kg (Calculated) : 82.2 Heparin Dosing Weight: 104 kg  Vital Signs: Temp: 98 F (36.7 C) (01/09 1120) Temp src: Oral (01/09 1120) BP: 115/66 mmHg (01/09 1200) Pulse Rate: 63 (01/09 1200)  Labs:  Recent Labs  10/21/13 0800 10/21/13 1830 10/21/13 1900 10/22/13 0500 10/22/13 0622 10/22/13 1025  HGB 15.2  --  15.2  --   --  13.2  HCT 45.9  --  44.9  --   --  39.3  PLT 204  --  165  --   --  158  APTT  --   --  73*  --   --   --   LABPROT  --   --  19.6*  --   --   --   INR  --   --  1.71*  --   --   --   HEPARINUNFRC  --   --   --  1.48* 0.41  --   CREATININE 1.15  --  1.14  --   --  0.93  TROPONINI <0.30 <0.30  --   --   --   --     Estimated Creatinine Clearance: 116.9 ml/min (by C-G formula based on Cr of 0.93).  Assessment: 58 y/o M on heparin for PE s/p tPA. Repeat HL was therapeutic. CBC and renal function stable. Plan will be to transition patients to new oral anticoagulant tomorrow morning.   Goal of Therapy:  Heparin level 0.3-0.5 units/ml until 1/9 at 2100, then 0.3-0.7 Monitor platelets by anticoagulation protocol: Yes   Plan:  Continue current Heparin 1450 units/hr Please stop heparin at 8am and administer first dose of Xarelto 15mg  BID x21 days, followed by Xarelto 20mg  daily w/ supper.  Gwendlyn Deutscher M 10/22/2013,3:36 PM

## 2013-10-22 NOTE — Care Management Note (Signed)
    Page 1 of 1   10/22/2013     1:25:20 PM   CARE MANAGEMENT NOTE 10/22/2013  Patient:  ANASTASIO, WOGAN   Account Number:  [id number]  Date Initiated:  10/22/2013  Documentation initiated by:  Elissa Hefty  Subjective/Objective Assessment:   adm w pul embolus     Action/Plan:   lives w wife   Anticipated DC Date:     Anticipated DC Plan:  [place]  CM consult  Medication Assistance      Choice offered to / List presented to:             Status of service:   Medicare Important Message given?   (If response is "NO", the following Medicare IM given date fields will be blank) Date Medicare IM given:   Date Additional Medicare IM given:    Discharge Disposition:  HOME/SELF CARE  Per UR Regulation:  Reviewed for med. necessity/level of care/duration of stay  If discussed at [place] of Stay Meetings, dates discussed:    Comments:  1/9 1322 debbie Mayley Lish rn,bsn pt may go home on xarelto. pt has [employer] ins. gave pt 30day free xarelto card and 5.00 per month copay card.

## 2013-10-22 NOTE — Progress Notes (Addendum)
ANTICOAGULATION CONSULT NOTE - Follow Up Consult  Pharmacy Consult for Heparin  Indication: pulmonary embolus, s/p tPA  Allergies  Allergen Reactions  . Scallops [Shellfish Allergy] Itching and Nausea And Vomiting   Patient Measurements: Height: 6\' 2"  (188 cm) Weight: 248 lb 0.3 oz (112.5 kg) IBW/kg (Calculated) : 82.2 Heparin Dosing Weight: 104 kg  Vital Signs: Temp: 98 F (36.7 C) (01/09 0451) Temp src: Oral (01/09 0451) BP: 106/62 mmHg (01/09 0500) Pulse Rate: 64 (01/09 0500)  Labs:  Recent Labs  10/21/13 0800 10/21/13 1830 10/21/13 1900 10/22/13 0500  HGB 15.2  --  15.2  --   HCT 45.9  --  44.9  --   PLT 204  --  165  --   APTT  --   --  73*  --   LABPROT  --   --  19.6*  --   INR  --   --  1.71*  --   HEPARINUNFRC  --   --   --  1.48*  CREATININE 1.15  --  1.14  --   TROPONINI <0.30 <0.30  --   --     Estimated Creatinine Clearance: 95.4 ml/min (by C-G formula based on Cr of 1.14).  Assessment: 58 y/o M on heparin for PE s/p tPA. HL was 1.48. This HL is inaccurate given that the blood was drawn from the same arm while heparin was infusing. RN to turn heparin off for a few minutes/flush/draw HL/turn heparin back on.   Goal of Therapy:  Heparin level 0.3-0.5 units/ml until 1/9 at 2100, then 0.3-0.7 Monitor platelets by anticoagulation protocol: Yes   Plan:  -Re-draw heparin level  Narda Bonds 10/22/2013,6:24 AM  Addendum 7:28 AM Repeat HL is 0.41, continue heparin drip at 1450 units/hr, confirmatory level at [place], PharmD

## 2013-10-22 NOTE — Consult Note (Signed)
ELECTROPHYSIOLOGY CONSULT NOTE    Patient ID: JAHSI PADBERG MRN: [id number], DOB/AGE: 06-23-1956 58 y.o.  Admit date: 10/21/2013 Date of Consult: 10-22-2013  Electrophysiologist: Virl Axe, MD  Reason for Consultation: Bradycardia  HPI:  [name] is a 58 year old male with a past medical history of atrial flutter, syncope, and abnormal EKG.  He was last seen by Dr Caryl Comes in August of 2013.  At that time, he had had no recurrence of his atrial flutter and did not want to pursue ablation.  He has had a remote history of syncope that occurred after a period of brief running and then stopping.  His EKG is abnormal and there is mention in the chart of need for further evaluation for possible Brugada.  Upon review however, he does not have a brugada pattern ekg  He presented to the ER on 10-21-13 with sharp left sided chest pain that occurred with deep inspiration.  He was found to have bilateral PE's.  While in the ER, he developed increasing pain and bradycardia with heart rates in the 40's requiring atropine.  This was also associated with diaphoresis and vomiting and occurred after receiving morphine.   He was admitted for anticoagulation initiation and further evaluation.  He was given tPA yesterday.    Echo today demonstrates an EF of 123456, grade 2 diastolic dysfunction, with RV poorly visualized.    EKG this morning demonstrated sinus rhythm with marked sinus arrhythmia and 1st degree AV block.  1st degree AV block has been present in the past.   He denies prior chest pain, shortness of breath, lower extremity edema, or palpitations.  ROS is otherwise negative except as outlined above.   EP has been asked to evaluate.   Past Medical History  Diagnosis Date  . Syncope   . Typical atrial flutter     Mali score - 0  . History of nuclear stress test     Abnormal  . First degree AV block   . Abnormal EKG     RSR prime in lead V1 with need to exclude Brugada Syndrome     Surgical  History: History reviewed. No pertinent past surgical history.   Prescriptions prior to admission  Medication Sig Dispense Refill  . Multiple Vitamin (MULTI VITAMIN MENS PO) Take by mouth daily.        Inpatient Medications:  . pantoprazole  40 mg Oral Daily  . [START ON 10/23/2013] pneumococcal 23 valent vaccine  0.5 mL Intramuscular Tomorrow-1000  . [START ON 10/23/2013] rivaroxaban  15 mg Oral BID WC   And  . [START ON 11/13/2013] rivaroxaban  20 mg Oral Q supper  . sodium chloride  2,000 mL Intravenous Once  . sodium chloride  3 mL Intravenous Q12H    Allergies:  Allergies  Allergen Reactions  . Scallops [Shellfish Allergy] Itching and Nausea And Vomiting    History   Social History  . Marital Status: Married    Spouse Name: N/A    Number of Children: N/A  . Years of Education: N/A   Occupational History  . Not on file.   Social History Main Topics  . Smoking status: Never Smoker   . Smokeless tobacco: Never Used  . Alcohol Use: No  . Drug Use: No  . Sexual Activity: Not on file   Other Topics Concern  . Not on file   Social History Narrative  . No narrative on file     Family History  Problem  Relation Age of Onset  . Hypertension Father   . Heart attack Father     Physical Exam: Filed Vitals:   10/22/13 1500 10/22/13 1600 10/22/13 1700 10/22/13 1920  BP: 118/66 115/64 101/66   Pulse: 67 70 66   Temp: 98.2 F (36.8 C)   98.8 F (37.1 C)  TempSrc: Oral   Oral  Resp: 18 18 20    Height:      Weight:      SpO2: 98% 97% 97%     GEN- The patient is well appearing, alert and oriented x 3 today.   Head- normocephalic, atraumatic Eyes-  Sclera clear, conjunctiva pink Ears- hearing intact Oropharynx- clear Neck- supple, no JVP Lymph- no cervical lymphadenopathy Lungs- Clear to ausculation bilaterally, normal work of breathing Heart- Regular rate and rhythm, no murmurs, rubs or gallops, PMI not laterally displaced GI- soft, NT, ND, + BS Extremities-  no clubbing, cyanosis, or edema MS- no significant deformity or atrophy Skin- no rash or lesion Psych- euthymic mood, full affect Neuro- strength and sensation are intact   Labs:   Lab Results  Component Value Date   WBC 7.5 10/22/2013   HGB 13.2 10/22/2013   HCT 39.3 10/22/2013   MCV 88.7 10/22/2013   PLT 158 10/22/2013     Recent Labs Lab 10/21/13 1900 10/22/13 1025  NA 140 140  K 4.3 4.1  CL 102 103  CO2 27 26  BUN 11 12  CREATININE 1.14 0.93  CALCIUM 8.3* 8.1*  PROT 6.8  --   BILITOT 0.9  --   ALKPHOS 61  --   ALT 16  --   AST 17  --   GLUCOSE 106* 99    Lab Results  Component Value Date   DDIMER 7.30* 10/21/2013     Radiology/Studies: Dg Chest 2 View 10/21/2013   CLINICAL DATA:  Chest pain, chills  EXAM: CHEST  2 VIEW  COMPARISON:  12/25/2007  FINDINGS: The heart size and mediastinal contours are within normal limits. Both lungs are clear. The visualized skeletal structures are unremarkable.  IMPRESSION: No active cardiopulmonary disease.   Electronically Signed   By: Skipper Cliche M.D.   On: 10/21/2013 08:31   Ct Angio Chest Pe W/cm &/or Wo Cm 10/21/2013   CLINICAL DATA:  Chest pain  EXAM: CT ANGIOGRAPHY CHEST WITH CONTRAST  TECHNIQUE: Multidetector CT imaging of the chest was performed using the standard protocol during bolus administration of intravenous contrast. Multiplanar CT image reconstructions including MIPs were obtained to evaluate the vascular anatomy.  CONTRAST:  135mL OMNIPAQUE IOHEXOL 350 MG/ML SOLN  COMPARISON:  None.  FINDINGS: There is extensive bilateral acute pulmonary thromboembolism. There is nearly occlusive thrombus in the distal right pulmonary artery extending into all 3 lobar vessels as well as segmental branches. There is also nonocclusive filling defect in the distal left pulmonary artery extending into left upper and lower lobe branches. No evidence of right heart strain based on the right ventricle to left ventricle ratio.  Extensive calcification in  the left anterior descending coronary artery.  No pericardial effusion.  No abnormal adenopathy.  Ground-glass opacities are present at the base of the lingula.  Cholelithiasis.  Review of the MIP images confirms the above findings.  IMPRESSION: Acute pulmonary thromboembolism.  Cholelithiasis.  Critical Value/emergent results were called by telephone at the time of interpretation on 10/21/2013 at 9:39 AM to Dr. Jinny Blossom, [facility] , who verbally acknowledged these results.   Electronically Signed   By: Toni Amend  Hoss M.D.   On: 10/21/2013 09:39   Dg Chest Port 1 View 10/22/2013   CLINICAL DATA:  History of pulmonary embolism.  EXAM: PORTABLE CHEST - 1 VIEW  COMPARISON:  CT ANGIO CHEST W/CM &/OR WO/CM dated 10/21/2013; DG CHEST 2 VIEW dated 10/21/2013  FINDINGS: Midline trachea. Normal heart size for level of inspiration. Probable small left pleural effusion. No pneumothorax. Left greater than right bibasilar airspace disease.  IMPRESSION: Probable small left pleural effusion with adjacent atelectasis.  Minimal volume loss/ atelectasis at the right lung base.   Electronically Signed   By: Abigail Miyamoto M.D.   On: 10/22/2013 07:08   FYB:OFBPZ rhythm with 1st degree AV block (PR 326), rate 64  TELEMETRY: sinus rhythm with 1st degree AV block, sinus arrhythmia  A/P 1.  Transient AV block/ sinus bradycardia/ first degree AV block The patient had an episode of transient AV block 10/21/13.  This occurred in the ER after receiving morphine in the setting of nausea.  I suspect that this was likely a vagal episode, though his persistent first degree AV block is worrisome.  At this point, I would advise ongoing telemetry monitoring while in the hospital.  Avoid AV nodal blocking agents.  I would defer pacemaker implantation unless he has further AV block outside of the setting of vagal stimuli.  2. Previously abnormal ekg I do not appreciate brugada pattern by ekg at this time.  I do not think that his prior syncope was due to  ventricular arrhythmias.  No further inpatient workup planned. He should follow-up with Dr Caryl Comes  3. Acute PTE S/p lytics Plans are noted to transition to xarelto from heparin drip.  I think that this is reasonable.  Further workup of causes of PTE per primary team.  EP to see as needed over the weekend. I will arrange follow-up with Dr Caryl Comes in the office.  Please call if questions/ further problems arise.

## 2013-10-22 NOTE — Progress Notes (Signed)
Name: Cameron Huerta MRN: [id number] DOB: 25-Sep-1956    ADMISSION DATE:  10/21/2013 CONSULTATION DATE: 10/21/13    REFERRING MD :  [facility]  PRIMARY SERVICE: PCCM  CHIEF COMPLAINT:  sob  BRIEF PATIENT DESCRIPTION:  58 yo admitted with massive bilateral PE, given heparin gtt in ED, had subsequent bradycardia requiring atropine, Hep gtt held and tPA protocol initiated, did not require intubation  SIGNIFICANT EVENTS / STUDIES:  1/8 CTA with bilateral PE --> Hep gtt 1/8 bradycardia requiring Atropine 1/8 tPA protocol  LINES / TUBES: PIV  CULTURES: 1/8 UCx  ANTIBIOTICS: None  HISTORY OF PRESENT ILLNESS:  58 yo male with hx significant for paroxysmal atrial flutter previous evaluated by Cardiology on no anticoagulation, who presented to ED with complaints of shortness of breath and sharp non-radiating left sided chest pain on deep inspiration for prior 2-3 days. This was associated with productive cough, chills,low grade fever, bodyaches and headache. States wife had recent flu-like symptoms thus had attributed his condition to possible flu.  CXR unremarkable and EKG unchanged from prior.  D-dimer found to be elevated at 7.3 with subsequent CTA of chest demonstrating extensive bilateral pulmonary embolism with nearly occlusive thrombus in the  distal right pulmonary artery extending into all 3 lobar vessels as well as segmental branches. There was also nonocclusive filling defect in the distal left pulmonary artery extending into left upper and lower lobe branches.  He was started on IV Heparin with plan for admission given large amount of clot burden. Several hours later while still in ED pt's chest pain progressed from intermittent to constant on inspiration. He became diaphoretic,tachypneic, and bradycardic with heart rate in the 40's and O2 stas in the 80's. 1 amp atropine given with resolution of bradycardia.  He was placed on non-rebreather mask with improvement in oxygenation.  Of note, he  did vomit a large quantity of emesis. PCCM was consulted to assess for further management.  SUBJ - denies CP Feels better breathing ok, no leg pain  VITAL SIGNS: Temp:  [98 F (36.7 C)-99.8 F (37.7 C)] 98 F (36.7 C) (01/09 0451) Pulse Rate:  [44-206] 64 (01/09 0500) Resp:  [14-30] 19 (01/09 0500) BP: (57-146)/(33-89) 106/62 mmHg (01/09 0500) SpO2:  [92 %-100 %] 100 % (01/09 0500) FiO2 (%):  [100 %] 100 % (01/08 1529) Weight:  [240 lb (108.863 kg)-248 lb 0.3 oz (112.5 kg)] 248 lb 0.3 oz (112.5 kg) (01/09 0500)    INTAKE / OUTPUT: Intake/Output     01/08 0701 - 01/09 0700   I.V. (mL/kg) 3001.6 (26.7)   Total Intake(mL/kg) 3001.6 (26.7)   Urine (mL/kg/hr) 1100   Total Output 1100   Net +1901.6         PHYSICAL EXAMINATION: General: Well-developed, well-nourished, family at bedside Head: Normocephalic, atraumatic. Eyes: PERRLA, EOMI Lungs: Normal respiratory effort. Clear to auscultation bilaterally from apices to bases without crackles or wheezes appreciated. Heart: normal rate, regular rhythm, normal S1 and S2, no gallop, murmur, or rubs appreciated. Abdomen: BS normoactive. Soft, Nondistended, non-tender. No masses or organomegaly appreciated. Extremities: No pretibial edema, distal pulses intact Neurologic: grossly non-focal, alert and oriented x3   LABS: CBC  Recent Labs Lab 10/21/13 0800 10/21/13 1900  WBC 5.5 11.0*  HGB 15.2 15.2  HCT 45.9 44.9  PLT 204 165   Coag's  Recent Labs Lab 10/21/13 1900  APTT 73*  INR 1.71*    BMET  Recent Labs Lab 10/21/13 0800 10/21/13 1900  NA 142 140  K 4.7 4.3  CL 102 102  CO2 27 27  BUN 14 11  CREATININE 1.15 1.14  GLUCOSE 101* 106*   Electrolytes  Recent Labs Lab 10/21/13 0800 10/21/13 1900  CALCIUM 9.3 8.3*  MG  --  1.6  PHOS  --  3.3   ABG  Recent Labs Lab 10/21/13 1840  PHART 7.401  PCO2ART 40.7  PO2ART 76.0*   Liver Enzymes  Recent Labs Lab 10/21/13 1900  AST 17  ALT 16   ALKPHOS 61  BILITOT 0.9  ALBUMIN 3.3*   Cardiac Enzymes  Recent Labs Lab 10/21/13 0800 10/21/13 1830  TROPONINI <0.30 <0.30  PROBNP  --  155.5*   Glucose  Recent Labs Lab 10/21/13 1806  GLUCAP 80    Imaging Dg Chest 2 View  10/21/2013   CLINICAL DATA:  Chest pain, chills  EXAM: CHEST  2 VIEW  COMPARISON:  12/25/2007  FINDINGS: The heart size and mediastinal contours are within normal limits. Both lungs are clear. The visualized skeletal structures are unremarkable.  IMPRESSION: No active cardiopulmonary disease.   Electronically Signed   By: Esperanza Heiraymond  Rubner M.D.   On: 10/21/2013 08:31   Ct Angio Chest Pe W/cm &/or Wo Cm  10/21/2013   CLINICAL DATA:  Chest pain  EXAM: CT ANGIOGRAPHY CHEST WITH CONTRAST  TECHNIQUE: Multidetector CT imaging of the chest was performed using the standard protocol during bolus administration of intravenous contrast. Multiplanar CT image reconstructions including MIPs were obtained to evaluate the vascular anatomy.  CONTRAST:  100mL OMNIPAQUE IOHEXOL 350 MG/ML SOLN  COMPARISON:  None.  FINDINGS: There is extensive bilateral acute pulmonary thromboembolism. There is nearly occlusive thrombus in the distal right pulmonary artery extending into all 3 lobar vessels as well as segmental branches. There is also nonocclusive filling defect in the distal left pulmonary artery extending into left upper and lower lobe branches. No evidence of right heart strain based on the right ventricle to left ventricle ratio.  Extensive calcification in the left anterior descending coronary artery.  No pericardial effusion.  No abnormal adenopathy.  Ground-glass opacities are present at the base of the lingula.  Cholelithiasis.  Review of the MIP images confirms the above findings.  IMPRESSION: Acute pulmonary thromboembolism.  Cholelithiasis.  Critical Value/emergent results were called by telephone at the time of interpretation on 10/21/2013 at 9:39 AM to Dr. Aundra MilletMEGAN, [facility] , who  verbally acknowledged these results.   Electronically Signed   By: Maryclare BeanArt  Hoss M.D.   On: 10/21/2013 09:39     CXR:1/8 no acute findings  ASSESSMENT / PLAN:  PULMONARY A: massive bilateral pulmonary embolism s/p tpa in setting of near brady arrest P:   Cont Hep gtt x 24h, if uneventful - transition to xarelto in am - he prefers novel agent to coumadin after risk benefit discussion  CARDIOVASCULAR A: h/o atrial flutter with spontaneous conversion (Chads-Vasc 0), no cardioversion, no anti-coagulation First-degree AVB with Right axis-deviation (c/w prior tracings) --> question of 2nd degree Mobitz on EKG today Concern for Brugada syndrome Troponins negative 2D ECHO -nml LV & RV fn P:  Cardiology consulted (seen by Dr. Graciela HusbandsKlein 05/2012)--> EP to eval today monitor on tele Chads-Vasc Score of 2    Recent Labs Lab 10/21/13 0800 10/21/13 1830  TROPONINI <0.30 <0.30    RENAL A:  No issues P:   Monitor BMET D/c foley--> bedside commode  GASTROINTESTINAL A: no issues P:   Protonix IV SUP  HEMATOLOGIC A:  massive PE s/p tPA,  on Hep gtt P:  On Heparin gtt, pt prefers new agents for long term anticoagulation as opposed to Warfarin LE Dopplers today Cont to monitor Apprec Pharm recommendations  Recent Labs Lab 10/21/13 1900  INR 1.71*    INFECTIOUS A: cough and cold-like sx resolved prior to admission P:   Cont to monitor   Dispo: Encourage up to edge of bed with legs dependent, transfer to SDU today  Dorian Heckle, MD Internal Medicine Resident PGY 3  10/22/2013, 6:50 AM  Michail Sermon, [place] during the described time interval was provided by me and/or other providers on the critical care team.  I have reviewed this patient's available data, including medical history, events of note, physical examination and test results as part of my evaluation  Lyndsy Gilberto V.

## 2013-10-22 NOTE — Progress Notes (Signed)
Utilization review completed. Beniah Magnan, RN, BSN. 

## 2013-10-23 DIAGNOSIS — I2699 Other pulmonary embolism without acute cor pulmonale: Secondary | ICD-10-CM

## 2013-10-23 DIAGNOSIS — I441 Atrioventricular block, second degree: Secondary | ICD-10-CM

## 2013-10-23 LAB — BASIC METABOLIC PANEL
BUN: 12 mg/dL (ref 6–23)
CO2: 25 meq/L (ref 19–32)
Calcium: 8.4 mg/dL (ref 8.4–10.5)
Chloride: 104 mEq/L (ref 96–112)
Creatinine, Ser: 1.01 mg/dL (ref 0.50–1.35)
GFR calc Af Amer: >90 mL/min (ref >90)
GFR calc non Af Amer: 81 mL/min — ABNORMAL LOW (ref >90)
GLUCOSE: 95 mg/dL (ref 70–99)
POTASSIUM: 4.2 meq/L (ref 3.7–5.3)
SODIUM: 139 meq/L (ref 137–147)

## 2013-10-23 LAB — CBC
HCT: 38.7 % — ABNORMAL LOW (ref 39.0–52.0)
HEMOGLOBIN: 13.2 g/dL (ref 13.0–17.0)
MCH: 30 pg (ref 26.0–34.0)
MCHC: 34.1 g/dL (ref 30.0–36.0)
MCV: 88 fL (ref 78.0–100.0)
Platelets: 163 10*3/uL (ref 150–400)
RBC: 4.4 MIL/uL (ref 4.22–5.81)
RDW: 13.4 % (ref 11.5–15.5)
WBC: 6 10*3/uL (ref 4.0–10.5)

## 2013-10-23 LAB — HEPARIN LEVEL (UNFRACTIONATED): HEPARIN UNFRACTIONATED: 0.44 [IU]/mL (ref 0.30–0.70)

## 2013-10-23 NOTE — Progress Notes (Signed)
VASCULAR LAB PRELIMINARY  PRELIMINARY  PRELIMINARY  PRELIMINARY  Bilateral lower extremity venous Dopplers completed.    Preliminary report:  There is acute, partially occlusive DVT noted in the right common femoral vein and acute, occlusive DVT noted throughout the femoral vein.  There is no propagation noted to the left lower extremity. Suraya Vidrine, RVT 10/23/2013, 11:51 AM

## 2013-10-23 NOTE — Progress Notes (Signed)
Name: Cameron Huerta MRN: [id number] DOB: 1956-05-14    ADMISSION DATE:  10/21/2013 CONSULTATION DATE: 10/21/13    REFERRING MD :  [facility]  PRIMARY SERVICE: PCCM  CHIEF COMPLAINT:  sob  BRIEF PATIENT DESCRIPTION:  58 yo admitted with massive bilateral PE, given heparin gtt in ED, had subsequent bradycardia requiring atropine, Hep gtt held and tPA protocol initiated, did not require intubation  SIGNIFICANT EVENTS / STUDIES:  1/8 CTA with bilateral PE --> Hep gtt 1/8 bradycardia requiring Atropine 1/8 tPA protocol  LINES / TUBES: PIV  CULTURES: 1/8 UCx  ANTIBIOTICS: None  HISTORY OF PRESENT ILLNESS:  58 yo male with hx significant for paroxysmal atrial flutter previous evaluated by Cardiology on no anticoagulation, who presented to ED with complaints of shortness of breath and sharp non-radiating left sided chest pain on deep inspiration for prior 2-3 days. This was associated with productive cough, chills,low grade fever, bodyaches and headache. States wife had recent flu-like symptoms thus had attributed his condition to possible flu.  CXR unremarkable and EKG unchanged from prior.  D-dimer found to be elevated at 7.3 with subsequent CTA of chest demonstrating extensive bilateral pulmonary embolism with nearly occlusive thrombus in the  distal right pulmonary artery extending into all 3 lobar vessels as well as segmental branches. There was also nonocclusive filling defect in the distal left pulmonary artery extending into left upper and lower lobe branches.  He was started on IV Heparin with plan for admission given large amount of clot burden. Several hours later while still in ED pt's chest pain progressed from intermittent to constant on inspiration. He became diaphoretic,tachypneic, and bradycardic with heart rate in the 40's and O2 stas in the 80's. 1 amp atropine given with resolution of bradycardia.  He was placed on non-rebreather mask with improvement in oxygenation.  Of note, he  did vomit a large quantity of emesis. PCCM was consulted to assess for further management.  SUBJ - Denies dyspnea, pain or bleeding at rest on O2. Family in room. Questions about duration of anticoagulation discussed.  VITAL SIGNS: Temp:  [98.2 F (36.8 C)-99.3 F (37.4 C)] 98.6 F (37 C) (01/10 0437) Pulse Rate:  [61-76] 73 (01/10 0437) Resp:  [12-21] 17 (01/10 0437) BP: (101-134)/(52-75) 134/72 mmHg (01/10 0437) SpO2:  [91 %-98 %] 94 % (01/10 0437) Weight:  [112.537 kg (248 lb 1.6 oz)] 112.537 kg (248 lb 1.6 oz) (01/10 0500)    INTAKE / OUTPUT: Intake/Output     01/09 0701 - 01/10 0700 01/10 0701 - 01/11 0700   P.O.  360   I.V. (mL/kg) 551.2 (4.9)    Total Intake(mL/kg) 551.2 (4.9) 360 (3.2)   Urine (mL/kg/hr) 1825 (0.7) 800 (1.4)   Total Output 1825 800   Net -1273.8 -440          PHYSICAL EXAMINATION: General: Well-developed, well-nourished, family at bedside Head: Normocephalic, atraumatic. Eyes: PERRLA, EOMI Lungs: Normal respiratory effort. Clear to auscultation bilaterally from apices to bases without crackles or wheezes appreciated. Heart: normal rate, regular rhythm, normal S1 and S2, no gallop, murmur, or rubs appreciated. Abdomen: BS normoactive. Soft, Nondistended, non-tender. No masses or organomegaly appreciated. Extremities: No pretibial edema, distal pulses intact Neurologic: grossly non-focal, alert and oriented x3   LABS: CBC  Recent Labs Lab 10/21/13 1900 10/22/13 1025 10/23/13 0325  WBC 11.0* 7.5 6.0  HGB 15.2 13.2 13.2  HCT 44.9 39.3 38.7*  PLT 165 158 163   Coag's  Recent Labs Lab 10/21/13 1900  APTT 73*  INR 1.71*    BMET  Recent Labs Lab 10/21/13 1900 10/22/13 1025 10/23/13 0325  NA 140 140 139  K 4.3 4.1 4.2  CL 102 103 104  CO2 27 26 25   BUN 11 12 12   CREATININE 1.14 0.93 1.01  GLUCOSE 106* 99 95   Electrolytes  Recent Labs Lab 10/21/13 1900 10/22/13 1025 10/23/13 0325  CALCIUM 8.3* 8.1* 8.4  MG 1.6  --    --   PHOS 3.3  --   --    ABG  Recent Labs Lab 10/21/13 1840  PHART 7.401  PCO2ART 40.7  PO2ART 76.0*   Liver Enzymes  Recent Labs Lab 10/21/13 1900  AST 17  ALT 16  ALKPHOS 61  BILITOT 0.9  ALBUMIN 3.3*   Cardiac Enzymes  Recent Labs Lab 10/21/13 0800 10/21/13 1830  TROPONINI <0.30 <0.30  PROBNP  --  155.5*   Glucose  Recent Labs Lab 10/21/13 1806  GLUCAP 80    Imaging Dg Chest Port 1 View  10/22/2013   CLINICAL DATA:  History of pulmonary embolism.  EXAM: PORTABLE CHEST - 1 VIEW  COMPARISON:  CT ANGIO CHEST W/CM &/OR WO/CM dated 10/21/2013; DG CHEST 2 VIEW dated 10/21/2013  FINDINGS: Midline trachea. Normal heart size for level of inspiration. Probable small left pleural effusion. No pneumothorax. Left greater than right bibasilar airspace disease.  IMPRESSION: Probable small left pleural effusion with adjacent atelectasis.  Minimal volume loss/ atelectasis at the right lung base.   Electronically Signed   By: Abigail Miyamoto M.D.   On: 10/22/2013 07:08     CXR:1/8 no acute findings  ASSESSMENT / PLAN:  PULMONARY A: massive bilateral pulmonary embolism s/p tpa in setting of near brady arrest Extensive DVT. Positive Family Hx of clot. P:   Transferred to Xarelto- he prefers novel agent to coumadin after risk benefit discussion  CARDIOVASCULAR A: h/o atrial flutter with spontaneous conversion (Chads-Vasc 0), no cardioversion, no anti-coagulation First-degree AVB with Right axis-deviation (c/w prior tracings) --> question of 2nd degree Mobitz on EKG  Concern for Brugada syndrome Troponins negative 2D ECHO -nml LV & RV fn P:  Cardiology consulted (seen by Dr. Caryl Comes 05/2012)--> EP note appreciated monitor on tele Chads-Vasc Score of 2     Recent Labs Lab 10/21/13 0800 10/21/13 1830  TROPONINI <0.30 <0.30    RENAL A:  No issues P:   Monitor BMET   GASTROINTESTINAL A: no issues P:   Protonix   HEMATOLOGIC A:  massive PE s/p tPA, on Hep gtt P:   On Heparin gtt, pt prefers new agents for long term anticoagulation as opposed to Warfarin LE Dopplers today Cont to monitor Apprec Pharm recommendations  Recent Labs Lab 10/21/13 1900  INR 1.71*    INFECTIOUS A: cough and cold-like sx resolved prior to admission P:   Cont to monitor   10/23/2013, 12:06 PM  Deneise Lever PCCM m [phone] After hours [phone]

## 2013-10-24 LAB — CBC
HEMATOCRIT: 40.2 % (ref 39.0–52.0)
HEMOGLOBIN: 13.7 g/dL (ref 13.0–17.0)
MCH: 29.8 pg (ref 26.0–34.0)
MCHC: 34.1 g/dL (ref 30.0–36.0)
MCV: 87.4 fL (ref 78.0–100.0)
Platelets: 216 10*3/uL (ref 150–400)
RBC: 4.6 MIL/uL (ref 4.22–5.81)
RDW: 13.2 % (ref 11.5–15.5)
WBC: 6.1 10*3/uL (ref 4.0–10.5)

## 2013-10-24 LAB — BASIC METABOLIC PANEL
BUN: 11 mg/dL (ref 6–23)
CALCIUM: 8.7 mg/dL (ref 8.4–10.5)
CO2: 26 meq/L (ref 19–32)
CREATININE: 0.97 mg/dL (ref 0.50–1.35)
Chloride: 99 mEq/L (ref 96–112)
GFR calc Af Amer: >90 mL/min (ref >90)
GFR calc non Af Amer: 90 mL/min — ABNORMAL LOW (ref >90)
GLUCOSE: 87 mg/dL (ref 70–99)
Potassium: 3.9 mEq/L (ref 3.7–5.3)
Sodium: 139 mEq/L (ref 137–147)

## 2013-10-24 LAB — URINE CULTURE

## 2013-10-24 NOTE — Progress Notes (Signed)
Pt ambulated with family in hallway 535ft. Pt did not have any complaints. Will continue to monitor pt closely.

## 2013-10-25 LAB — BASIC METABOLIC PANEL
BUN: 9 mg/dL (ref 6–23)
CALCIUM: 8.5 mg/dL (ref 8.4–10.5)
CO2: 27 mEq/L (ref 19–32)
CREATININE: 1 mg/dL (ref 0.50–1.35)
Chloride: 102 mEq/L (ref 96–112)
GFR calc Af Amer: >90 mL/min (ref >90)
GFR, EST NON AFRICAN AMERICAN: 82 mL/min — AB (ref >90)
Glucose, Bld: 98 mg/dL (ref 70–99)
Potassium: 4.1 mEq/L (ref 3.7–5.3)
Sodium: 139 mEq/L (ref 137–147)

## 2013-10-25 LAB — CBC
HCT: 36.2 % — ABNORMAL LOW (ref 39.0–52.0)
Hemoglobin: 12.3 g/dL — ABNORMAL LOW (ref 13.0–17.0)
MCH: 29.3 pg (ref 26.0–34.0)
MCHC: 34 g/dL (ref 30.0–36.0)
MCV: 86.2 fL (ref 78.0–100.0)
PLATELETS: 231 10*3/uL (ref 150–400)
RBC: 4.2 MIL/uL — AB (ref 4.22–5.81)
RDW: 13.2 % (ref 11.5–15.5)
WBC: 4.7 10*3/uL (ref 4.0–10.5)

## 2013-10-25 MED ORDER — CIPROFLOXACIN HCL 250 MG PO TABS
250.0000 mg | ORAL_TABLET | Freq: Two times a day (BID) | ORAL | Status: DC
  Filled 2013-10-25 (×3): qty 1

## 2013-10-25 MED ORDER — TRAMADOL HCL 50 MG PO TABS: 50.0000 mg | ORAL_TABLET | Freq: Four times a day (QID) | ORAL | Status: DC | PRN

## 2013-10-25 MED ORDER — RIVAROXABAN 20 MG PO TABS: 20.0000 mg | ORAL_TABLET | Freq: Every day | ORAL | Status: DC

## 2013-10-25 MED ORDER — CIPROFLOXACIN HCL 250 MG PO TABS: 250.0000 mg | ORAL_TABLET | Freq: Two times a day (BID) | ORAL | Status: DC

## 2013-10-25 MED ORDER — RIVAROXABAN 15 MG PO TABS: 15.0000 mg | ORAL_TABLET | Freq: Two times a day (BID) | ORAL | Status: DC

## 2013-10-25 NOTE — Progress Notes (Signed)
Central telemetry notified that patient went into 2nd HB Type 1 for a few seconds at 0605 and then was SB; strip saved.

## 2013-10-25 NOTE — Progress Notes (Signed)
ANTICOAGULATION CONSULT NOTE - Follow Up Consult  Pharmacy Consult for Xarelto Indication: pulmonary embolus  Allergies  Allergen Reactions  . Scallops [Shellfish Allergy] Itching and Nausea And Vomiting    Patient Measurements: Height: 6\' 2"  (188 cm) Weight: 247 lb 9.6 oz (112.311 kg) IBW/kg (Calculated) : 82.2   Vital Signs: Temp: 98.5 F (36.9 C) (01/12 0353) Temp src: Oral (01/12 0353) BP: 149/88 mmHg (01/12 0353) Pulse Rate: 63 (01/12 0353)  Labs:  Recent Labs  10/22/13 1430 10/23/13 0325 10/24/13 0343 10/25/13 0520  HGB  --  13.2 13.7 12.3*  HCT  --  38.7* 40.2 36.2*  PLT  --  163 216 231  HEPARINUNFRC 0.50 0.44  --   --   CREATININE  --  1.01 0.97 1.00    Estimated Creatinine Clearance: 108.6 ml/min (by C-G formula based on Cr of 1).   Assessment: Cameron Huerta with B/L PE which required tPA on day of admission. Has transitioned from heparin drip to Xarelto. His CBC remains stable, any slight drops can likely be explained by dilution. No bleeding noted. Education on Xarelto has been completed.  Goal of Therapy:  proper dosing based on renal and hepatic function Monitor platelets by anticoagulation protocol: Yes   Plan:  1. Continue Xarelto 15mg  BID x21 days (last dose is 1/31) then start Xarelto 20mg  daily with supper 2. Follow CBC and for any s/s bleeding  Cosandra Plouffe D. Emireth Cockerham, PharmD, BCPS Clinical Pharmacist Pager: [phone] 10/25/2013 9:27 AM

## 2013-10-25 NOTE — Progress Notes (Signed)
Patient transported via wheelchair to main lobby for d/c home. Belongings with patient and wife.Cameron Huerta

## 2013-10-25 NOTE — Progress Notes (Signed)
Dr. Chase Caller informed of O2 sat and HR results while ambulating. Ok to continue with discharge home.Cindee Salt

## 2013-10-25 NOTE — Progress Notes (Signed)
STaff MD eval  Feels well. Chart reviewed. Can go home. OPD f with pulmonary NP Tammy and followed by MD Ann Lions  Dr. Brand Males, M.D., F.C.C.P Pulmonary and Critical Care Medicine Staff Physician [facility] Pager: [phone], If no answer or between  15:00h - 7:00h: call [phone]  10/25/2013 11:31 AM

## 2013-10-25 NOTE — Progress Notes (Signed)
Transient mobitz I second degree AV block noted on telemetry.  Sinus with nonconducted PACs also observed. No new recs at this time.

## 2013-10-25 NOTE — Discharge Summary (Signed)
Physician Discharge Summary  Patient ID: Cameron Huerta MRN: [id number] DOB/AGE: January 21, 1956 58 y.o.  Admit date: 10/21/2013 Discharge date: 10/25/2013  Problem List Principal Problem:   Pulmonary embolism, bilateral Active Problems:   Pulmonary embolism   PE (pulmonary embolism)  HPI: 58 yo male with hx significant for paroxysmal atrial flutter previous evaluated by Cardiology on no anticoagulation, who presented to ED with complaints of shortness of breath and sharp non-radiating left sided chest pain on deep inspiration for prior 2-3 days. This was associated with productive cough, chills,low grade fever, bodyaches and headache. States wife had recent flu-like symptoms thus had attributed his condition to possible flu. CXR unremarkable and EKG unchanged from prior. D-dimer found to be elevated at 7.3 with subsequent CTA of chest demonstrating extensive bilateral pulmonary embolism with nearly occlusive thrombus in the  distal right pulmonary artery extending into all 3 lobar vessels as well as segmental branches. There was also nonocclusive filling defect in the distal left pulmonary artery extending into left upper and lower lobe branches. He was started on IV Heparin with plan for admission given large amount of clot burden. Several hours later while still in ED pt's chest pain progressed from intermittent to constant on inspiration. He became diaphoretic,tachypneic, and bradycardic with heart rate in the 40's and O2 stas in the 80's. 1 amp atropine given with resolution of bradycardia. He was placed on non-rebreather mask with improvement in oxygenation. Of note, he did vomit a large quantity of emesis. PCCM was consulted to assess for further management.  Hospital Course: SIGNIFICANT EVENTS / STUDIES:  1/8 CTA with bilateral PE --> Hep gtt  1/8 bradycardia requiring Atropine  1/8 tPA protocol  LINES / TUBES:  PIV  CULTURES:  1/8 UCx >>KLEBSIELLA OZAENAE  ANTIBIOTICS:  None  CXR:1/8 no  acute findings  ASSESSMENT / PLAN:  PULMONARY  A: massive bilateral pulmonary embolism s/p tpa in setting of near brady arrest  Extensive DVT. Positive Family Hx of clot.  P:  Transferred to Xarelto- he prefers novel agent to coumadin after risk benefit discussion  CARDIOVASCULAR  A: h/o atrial flutter with spontaneous conversion (Chads-Vasc 0), no cardioversion, no anti-coagulation  First-degree AVB with Right axis-deviation (c/w prior tracings) --> question of 2nd degree Mobitz on EKG  Concern for Brugada syndrome  Troponins negative  2D ECHO -nml LV & RV fn  P:  Cardiology consulted (seen by Dr. Caryl Comes 05/2012)--> EP note appreciated  monitor on tele  Chads-Vasc Score of 2   Recent Labs  Lab  10/21/13 0800  10/21/13 1830   TROPONINI  <0.30  <0.30    RENAL  A: No issues  P:  Monitor BMET  GASTROINTESTINAL  A: no issues  P:  Protonix  HEMATOLOGIC  A: massive PE s/p tPA, on Hep gtt  P:  On Heparin gtt, pt prefers new agents for long term anticoagulation as opposed to Warfarin  LE Dopplers today  Cont to monitor  Apprec Pharm recommendations   Recent Labs  Lab  10/21/13 1900   INR  1.71*    INFECTIOUS  A: cough and cold-like sx resolved prior to admission  UTI  P:  Cont to monitor  Ciprofloxacin 250 mg bid x 2 days He will need urology consult as outpatient. Urology will call him, their number is [phone]       Labs at discharge Lab Results  Component Value Date   CREATININE 1.00 10/25/2013   BUN 9 10/25/2013   NA 139 10/25/2013  K 4.1 10/25/2013   CL 102 10/25/2013   CO2 27 10/25/2013   Lab Results  Component Value Date   WBC 4.7 10/25/2013   HGB 12.3* 10/25/2013   HCT 36.2* 10/25/2013   MCV 86.2 10/25/2013   PLT 231 10/25/2013   Lab Results  Component Value Date   ALT 16 10/21/2013   AST 17 10/21/2013   ALKPHOS 61 10/21/2013   BILITOT 0.9 10/21/2013   Lab Results  Component Value Date   INR 1.71* 10/21/2013   INR 1.1 RATIO* 03/02/2008   INR 1.0  12/25/2007    Current radiology studies No results found.  Disposition:        Discharge Orders   Future Appointments Provider Department Dept Phone   11/01/2013 4:15 PM Melvenia Needles, NP [facility] [phone]   11/22/2013 11:30 AM Deboraha Sprang, MD [facility] [phone]   Future Orders Complete By Expires   Discharge patient  As directed        Medication List         ciprofloxacin 250 MG tablet  Commonly known as:  CIPRO  Take 1 tablet (250 mg total) by mouth 2 (two) times daily.     MULTI VITAMIN MENS PO  Take by mouth daily.     Rivaroxaban 15 MG Tabs tablet  Commonly known as:  XARELTO  Take 1 tablet (15 mg total) by mouth 2 (two) times daily with a meal.     Rivaroxaban 20 MG Tabs tablet  Commonly known as:  XARELTO  Take 1 tablet (20 mg total) by mouth daily with supper.  Start taking on:  11/13/2013     traMADol 50 MG tablet  Commonly known as:  ULTRAM  Take 1 tablet (50 mg total) by mouth every 6 (six) hours as needed for moderate pain.       Follow-up Information   Follow up with PARRETT,TAMMY, NP On 11/01/2013. (4:15 pm)    Specialty:  Nurse Practitioner   Contact information:   [address] [place] [address] [phone]       Please follow up. (Follow up with urology. They will call ypu.)        Discharged Condition: good  Time spent on discharge greater than 40 minutes.  Vital signs at Discharge. Temp:  [98.3 F (36.8 C)-99.2 F (37.3 C)] 98.5 F (36.9 C) (01/12 0353) Pulse Rate:  [62-72] 63 (01/12 0353) Resp:  [17-19] 19 (01/12 0353) BP: (135-154)/(73-88) 149/88 mmHg (01/12 0353) SpO2:  [96 %-99 %] 96 % (01/12 0353) Weight:  [247 lb 9.6 oz (112.311 kg)] 247 lb 9.6 oz (112.311 kg) (01/12 0353) Office follow up Special Information or instructions. See T. Parrett ANP-BC then Dr. Chase Caller.    Signed: Richardson Landry Minor ACNP Maryanna Shape PCCM Pager [phone] till 3 pm If no answer page  [phone] 10/25/2013, 12:23 PM   Staff note PE patient. S/p tpa. Doing well. Dsiharge on 10/25/13. Fu with NP and then me. Needs Urology evaluation due to UTI finding. > 30 min dc planning   Dr. Brand Males, M.D., [facility].C.P Pulmonary and Critical Care Medicine Staff Physician [facility] Pager: [phone], If no answer or between  15:00h - 7:00h: call [phone]  11/02/2013 11:22 AM

## 2013-10-25 NOTE — Progress Notes (Signed)
Ambulated 300 ft independently on room air. O2 sat maintained 96% with lowest reading 93%. HR maintain 69-76. Patient denied any distress.Cindee Salt

## 2013-10-25 NOTE — Progress Notes (Signed)
Discharge instructions along with med list and scripts provided. IV d/c'd with catheter intact. Awaiting wife to return after picking up prescriptions.Cindee Salt

## 2013-11-01 ENCOUNTER — Encounter: Payer: Self-pay | Admitting: Adult Health

## 2013-11-01 ENCOUNTER — Ambulatory Visit (INDEPENDENT_AMBULATORY_CARE_PROVIDER_SITE_OTHER): Payer: BC Managed Care – PPO | Admitting: Adult Health

## 2013-11-01 VITALS — BP 134/82 | HR 79 | Temp 98.7°F | Ht 73.0 in | Wt 246.6 lb

## 2013-11-01 DIAGNOSIS — I2699 Other pulmonary embolism without acute cor pulmonale: Secondary | ICD-10-CM

## 2013-11-01 NOTE — Patient Instructions (Addendum)
Continue on Xarelto as directed.  Advance activity as tolerated.  Follow up with cardiology as planned  Follow up Dr. Lake Bells in 4 weeks and As needed

## 2013-11-01 NOTE — Progress Notes (Signed)
Subjective:    Patient ID: Cameron Huerta, male    DOB: 06-16-1956, 58 y.o.   MRN: [id number]  HPI 58 year old male admitted January 8 for acute bilateral pulmonary embolism . Admitted by [facility]   11/01/2013 post hospital followup. Patient was admitted January 8 through January 12 for acute bilateral pulmonary embolism. Patient has a history of atrial fib , not on chronic anticoagulation. Patient presented to the emergency room with flulike symptoms with shortness, of breath, and low-grade fever. A CT chest showed extensive bilateral pulmonary embolism with nearly occlusive thrombus in the right pulmonary artery extending into left upper and lower lobe branches.  Venous Doppler showed an acute DVT in right leg, involving the common femoral, and femoral veins of the right lower extreme Patient was started on IV heparin. He had a large clot burden. Patient did have an acute episode in the emergency room with hypoxia and bradycardia requiring atropine. TPA given.   2-D echo showed mild LVH, grade 2 diastolic dysfunction, and right ventricle was poorly visualized.  Patient was transitioned over to Xarelto prior to discharge.  Factor V leiden neg , Protein S and C nml , Lupus Anticoagulant neg , Antithrombin III nml   Pt reports breathing is doing well.  no new complaints - denies wheezing, dyspnea, chest tightness, cough, congestion, nausea, vomiting Chest pain and tightness has resolved. No dyspnea or DOE.  Denies any recent travel , known inury . No recent surgery.  No testosterone use. No etoh or drug.  Father had PE in past .   Review of Systems Constitutional:   No  weight loss, night sweats,  Fevers, chills, fatigue, or  lassitude.  HEENT:   No headaches,  Difficulty swallowing,  Tooth/dental problems, or  Sore throat,                No sneezing, itching, ear ache, nasal congestion, post nasal drip,   CV:  No chest pain,  Orthopnea, PND, swelling in lower extremities, anasarca, dizziness,  palpitations, syncope.   GI  No heartburn, indigestion, abdominal pain, nausea, vomiting, diarrhea, change in bowel habits, loss of appetite, bloody stools.   Resp: No shortness of breath with exertion or at rest.  No excess mucus, no productive cough,  No non-productive cough,  No coughing up of blood.  No change in color of mucus.  No wheezing.  No chest wall deformity  Skin: no rash or lesions.  GU: no dysuria, change in color of urine, no urgency or frequency.  No flank pain, no hematuria   MS:  No joint pain or swelling.  No decreased range of motion.  No back pain.  Psych:  No change in mood or affect. No depression or anxiety.  No memory loss.         Objective:   Physical Exam GEN: A/Ox3; pleasant , NAD, well nourished   HEENT:  [place]/AT,  EACs-clear, TMs-wnl, NOSE-clear, THROAT-clear, no lesions, no postnasal drip or exudate noted.   NECK:  Supple w/ fair ROM; no JVD; normal carotid impulses w/o bruits; no thyromegaly or nodules palpated; no lymphadenopathy.  RESP  Clear  P & A; w/o, wheezes/ rales/ or rhonchi.no accessory muscle use, no dullness to percussion  CARD:  RRR, no m/r/g  , no peripheral edema, pulses intact, no cyanosis or clubbing.  GI:   Soft & nt; nml bowel sounds; no organomegaly or masses detected.  Musco: Warm bil, no deformities or joint swelling noted.   Neuro: alert,  no focal deficits noted.    Skin: Warm, no lesions or rashes         Assessment & Plan:

## 2013-11-01 NOTE — Assessment & Plan Note (Addendum)
Unprovoked PE , w/ right extensive DVT s/p heparin drip , and TPA w/ transition to Xarelto . Hypercoagulable panel unrevealing .  Most likley will need lifelong therapy d/t extensive clots and unknown reason for PE /DVT and hx of  atrial fib .   Plan  Cont on Xarelto   follow up Dr. Lake Bells in 4  weeks and As needed

## 2013-11-22 ENCOUNTER — Encounter: Payer: Self-pay | Admitting: Internal Medicine

## 2013-11-22 ENCOUNTER — Ambulatory Visit (INDEPENDENT_AMBULATORY_CARE_PROVIDER_SITE_OTHER): Payer: BC Managed Care – PPO | Admitting: Internal Medicine

## 2013-11-22 VITALS — BP 144/90 | HR 61 | Ht 74.0 in | Wt 247.0 lb

## 2013-11-22 DIAGNOSIS — I4892 Unspecified atrial flutter: Secondary | ICD-10-CM

## 2013-11-22 DIAGNOSIS — I2699 Other pulmonary embolism without acute cor pulmonale: Secondary | ICD-10-CM

## 2013-11-22 DIAGNOSIS — I498 Other specified cardiac arrhythmias: Secondary | ICD-10-CM

## 2013-11-22 DIAGNOSIS — R001 Bradycardia, unspecified: Secondary | ICD-10-CM

## 2013-11-22 NOTE — Patient Instructions (Signed)
Your physician recommends that you continue on your current medications as directed. Please refer to the Current Medication list given to you today.  Your physician has requested that you have an echocardiogram. Echocardiography is a painless test that uses sound waves to create images of your heart. It provides your doctor with information about the size and shape of your heart and how well your heart's chambers and valves are working. This procedure takes approximately one hour. There are no restrictions for this procedure.  Your physician recommends that you schedule a follow-up appointment in: 3 months with Dr. Klein.   

## 2013-11-22 NOTE — Assessment & Plan Note (Signed)
The patient is on anticoagulation. In the absence of a specific cause, I think the current recommendations would be that in the setting of a little bleeding risks and definite anticoagulation would be indicated.  Furthermore, there is a family history of spontaneous abortion; this raises the possibility of familial coagulopathy. More specific evaluation for this would be indicated I think. I will wait to see what Dr. MR as to say

## 2013-11-22 NOTE — Assessment & Plan Note (Signed)
No intercurrent atrial flutter.

## 2013-11-22 NOTE — Progress Notes (Signed)
      Patient Care Team: [name] Patient as PCP - General (General Practice)   HPI  Cameron Huerta is a 58 y.o. male Seen in followup for atrial flutter. He has a CHADS-VASc score of 0 and has has chosen not to pursue further therapy  He was recently hospitalized for pulmonary embolism. Treated with  Rivaroxaban  And in in a Echocardiogram demonstrated normal ejection fraction with poor visualization of the right ventricle. ECG also demonstrated acute right axis deviation. First degree AV block was also noted.  Is a fairly history of spontaneous abortion  Past Medical History  Diagnosis Date  . Syncope     after abrupt cessation of exercise  . Typical atrial flutter     Mali score - 0  . History of nuclear stress test     Abnormal  . First degree AV block   . Pulmonary embolism 10/2013    No past surgical history on file.  Current Outpatient Prescriptions  Medication Sig Dispense Refill  . Multiple Vitamin (MULTI VITAMIN MENS PO) Take by mouth daily.      . Rivaroxaban (XARELTO) 20 MG TABS tablet Take 1 tablet (20 mg total) by mouth daily with supper.  30 tablet  3  . traMADol (ULTRAM) 50 MG tablet Take 1 tablet (50 mg total) by mouth every 6 (six) hours as needed for moderate pain.  30 tablet  1   No current facility-administered medications for this visit.    Allergies  Allergen Reactions  . Scallops [Shellfish Allergy] Itching and Nausea And Vomiting    Review of Systems negative except from HPI and PMH  Physical Exam BP 144/90  Pulse 61  Ht 6\' 2"  (1.88 m)  Wt 247 lb (112.038 kg)  BMI 31.70 kg/m2 Well developed and well nourished in no acute distress HENT normal E scleral and icterus clear Neck Supple JVP flat; carotids brisk and full Clear to ausculation  Regular rate and rhythm, no murmurs gallops or rub Soft with active bowel sounds No clubbing cyanosis none Edema Alert and oriented, grossly normal motor and sensory function Skin Warm and  Dry  ECG demonstrates sinus rhythm at 61 Intervals 29/10/39 Right axis deviation>>136   Assessment and  Plan

## 2013-11-29 ENCOUNTER — Encounter: Payer: Self-pay | Admitting: Pulmonary Disease

## 2013-11-29 ENCOUNTER — Ambulatory Visit (INDEPENDENT_AMBULATORY_CARE_PROVIDER_SITE_OTHER): Payer: BC Managed Care – PPO | Admitting: Pulmonary Disease

## 2013-11-29 VITALS — BP 152/86 | HR 68 | Temp 97.6°F | Ht 74.0 in | Wt 251.4 lb

## 2013-11-29 DIAGNOSIS — I2699 Other pulmonary embolism without acute cor pulmonale: Secondary | ICD-10-CM

## 2013-11-29 MED ORDER — RIVAROXABAN 20 MG PO TABS: 20.0000 mg | ORAL_TABLET | Freq: Every day | ORAL | Status: DC

## 2013-11-29 NOTE — Progress Notes (Signed)
Subjective:    Patient ID: Cameron Cameron Huerta, male    DOB: January 15, 1956, 58 y.o.   MRN: [id number]  Synopsis: Mr. Cameron Cameron Huerta had a massive pulmonary embolism in January 2015 which was unprovoked and required tPA. A hypercoagulability panel was negative.  HPI  11/29/2013 ROV >> Mr. Cameron Cameron Huerta is doing well.  He is back to regular activity with ellipictal exercise which doesn't cause shortness of breath. He has not experienced bleeding of any kind or leg swelling.  Overall he feels very well.  He has followed with Dr. Caryl Comes who does not feel there is a need for further cardiac intervention at this time.  In general, Mr. Cameron Cameron Huerta is doing OK.   Past Medical History  Diagnosis Date  . Syncope     after abrupt cessation of exercise  . Typical atrial flutter     Mali score - 0  . History of nuclear stress test     Abnormal  . First degree AV block   . Pulmonary embolism 10/2013     Review of Systems     Objective:   Physical Exam  Filed Vitals:   11/29/13 0940  BP: 152/86  Pulse: 68  Temp: 97.6 F (36.4 C)  TempSrc: Oral  Height: 6\' 2"  (1.88 m)  Weight: 251 lb 6.4 oz (114.034 kg)  SpO2: 100%  RA  Gen: well appearing, no acute distress HEENT: NCAT, PERRL, EOMi, OP clear, neck supple without masses PULM: CTA B CV: RRR, no mgr, no JVD AB: BS+, soft, nontender, no hsm Ext: warm, no edema, no clubbing, no cyanosis Derm: no rash or skin breakdown Neuro: A&Ox4, CN II-XII intact, strength 5/5 in all 4 extremities       Assessment & Plan:   Pulmonary embolism, bilateral Mr. Cameron Cameron Huerta had a massive unprovoked PE in January 2015.  His age-appropriate screening is up to date and normal and his blood testing for hereditary coagulopathies was negative.  However, he notes that his sister had a miscarriage at some point.  In terms of coagulopathy, this would suggest lupus anticoagulant, but his blood work for this was normal.    At this point the best approach for treating this idiopathic massive PE is  to treat with full dose anticoagulation for as long as he has low risk of bleeding.  In other words, I would recommend treating for life as long as his bleeding risk is low. Several studies have shown a reduced risk of recurrent DVT/PE in patients who were treated for periods longer than 3 months.  In fact, the current guidelines from the ACCP recommend treating "longer than 3 months" as long as the patient is low risk of bleeding.    Plan: -continue Xarelto for life if bleeding risk remains low -educated today regarding use of anticoagulants and bleeding risk, bridging if surgery ever needed -he will follow with his PCP unless he desires continued Pulmonary Follow up -exercise encouraged  Reference: Antithrombotic Therapy and Prevention of Thrombosis, 9th Ed: [employer] Evidence-Based Clinical Practice Guidelines Chest. 2012;141(2_suppl):7S-47S. doi:10.1378/[email or web address].1412S3   Updated Medication List Outpatient Encounter Prescriptions as of 11/29/2013  Medication Sig  . Multiple Vitamin (MULTI VITAMIN MENS PO) Take by mouth daily.  . Rivaroxaban (XARELTO) 20 MG TABS tablet Take 1 tablet (20 mg total) by mouth daily with supper.  . [DISCONTINUED] Rivaroxaban (XARELTO) 20 MG TABS tablet Take 1 tablet (20 mg total) by mouth daily with supper.  . [DISCONTINUED] traMADol (ULTRAM) 50 MG tablet Take 1  tablet (50 mg total) by mouth every 6 (six) hours as needed for moderate pain.

## 2013-11-29 NOTE — Assessment & Plan Note (Addendum)
Cameron Huerta had a massive unprovoked PE in January 2015.  His age-appropriate screening is up to date and normal and his blood testing for hereditary coagulopathies was negative.  However, he notes that his sister had a miscarriage at some point.  In terms of coagulopathy, this would suggest lupus anticoagulant, but his blood work for this was normal.    At this point the best approach for treating this idiopathic massive PE is to treat with full dose anticoagulation for as long as he has low risk of bleeding.  In other words, I would recommend treating for life as long as his bleeding risk is low. Several studies have shown a reduced risk of recurrent DVT/PE in patients who were treated for periods longer than 3 months.  In fact, the current guidelines from the ACCP recommend treating "longer than 3 months" as long as the patient is low risk of bleeding.    Plan: -continue Xarelto for life if bleeding risk remains low -educated today regarding use of anticoagulants and bleeding risk, bridging if surgery ever needed -he will follow with his PCP unless he desires continued Pulmonary Follow up -exercise encouraged  Reference: Antithrombotic Therapy and Prevention of Thrombosis, 9th Ed: [employer] Evidence-Based Clinical Practice Guidelines Chest. 2012;141(2_suppl):7S-47S. doi:10.1378/[email or web address].1412S3

## 2013-11-29 NOTE — Patient Instructions (Signed)
Continue taking the Xarelto indefinitely Follow up with your primary care physician, but we are always happy to see you again as needed

## 2014-02-14 ENCOUNTER — Other Ambulatory Visit (HOSPITAL_COMMUNITY): Payer: BC Managed Care – PPO

## 2014-02-15 ENCOUNTER — Encounter (HOSPITAL_COMMUNITY): Payer: Self-pay | Admitting: Internal Medicine

## 2014-02-16 ENCOUNTER — Encounter: Payer: BC Managed Care – PPO | Admitting: Internal Medicine

## 2014-02-16 NOTE — Progress Notes (Deleted)
      Patient Care Team: [name] Patient as PCP - General (General Practice)   HPI  Cameron Huerta is a 58 y.o. male Seen in followup for atrial flutter. He has a CHADS-VASc score of 0 and has has chosen not to pursue further therapy  He was recently hospitalized for pulmonary embolism.  Treated with Rivaroxaban  After much thought Pulmonary has recommended life long anticoagulation as long as bleeding risk remains low   And in in a Echocardiogram demonstrated normal ejection fraction with poor visualization of the right ventricle. ECG also demonstrated acute right axis deviation. First degree AV block was also noted.  Is a fairly history of spontaneous abortion   Past Medical History  Diagnosis Date  . Syncope     after abrupt cessation of exercise  . Typical atrial flutter     Mali score - 0  . History of nuclear stress test     Abnormal  . First degree AV block   . Pulmonary embolism 10/2013    No past surgical history on file.  Current Outpatient Prescriptions  Medication Sig Dispense Refill  . Multiple Vitamin (MULTI VITAMIN MENS PO) Take by mouth daily.      . Rivaroxaban (XARELTO) 20 MG TABS tablet Take 1 tablet (20 mg total) by mouth daily with supper.  30 tablet  11   No current facility-administered medications for this visit.    Allergies  Allergen Reactions  . Scallops [Shellfish Allergy] Itching and Nausea And Vomiting    Review of Systems negative except from HPI and PMH  Physical Exam There were no vitals taken for this visit. Well developed and well nourished in no acute distress HENT normal E scleral and icterus clear Neck Supple JVP flat; carotids brisk and full Clear to ausculation {CARD RHYTHM:10874} ***Regular rate and rhythm, no murmurs gallops or rub Soft with active bowel sounds No clubbing cyanosis {Numbers; edema:17696} Edema Alert and oriented, grossly normal motor and sensory function Skin Warm and Dry    Assessment and   Plan

## 2014-02-17 ENCOUNTER — Encounter: Payer: Self-pay | Admitting: Internal Medicine

## 2014-05-13 ENCOUNTER — Telehealth: Payer: Self-pay

## 2014-05-13 NOTE — Telephone Encounter (Signed)
Opened in error

## 2014-05-17 ENCOUNTER — Telehealth: Payer: Self-pay | Admitting: Pulmonary Disease

## 2014-05-17 MED ORDER — RIVAROXABAN 20 MG PO TABS: 20.0000 mg | ORAL_TABLET | Freq: Every day | ORAL | Status: DC

## 2014-05-17 NOTE — Telephone Encounter (Signed)
Called spoke with spouse. Pt needs xarelto sent to the pharm. rx was never received when pt was seen in feb. I have done so. Nothing further needed

## 2015-05-08 ENCOUNTER — Telehealth: Payer: Self-pay | Admitting: Pulmonary Disease

## 2015-05-08 MED ORDER — RIVAROXABAN 20 MG PO TABS: 20.0000 mg | ORAL_TABLET | Freq: Every day | ORAL | Status: AC

## 2015-05-08 NOTE — Telephone Encounter (Signed)
Per 11/29/13 OV: Patient Instructions       Continue taking the Xarelto indefinitely Follow up with your primary care physician, but we are always happy to see you again as needed  --  Called spoke with pt. She reports we last refilled pt xarelto w/ 11 refills. She scheduled appt for pt to see BQ on 05/10/15. I advised her per pt last instructions pt to f/u prn here and to f/u with PCP but she is still requesting we refill pt medication and he be seen here. Please advise thanks

## 2015-05-08 NOTE — Telephone Encounter (Signed)
OK, I'm happy to see him

## 2015-05-08 NOTE — Telephone Encounter (Signed)
I called spoke with spouse. Aware RX sent in. Nothing further needed

## 2015-05-10 ENCOUNTER — Ambulatory Visit: Payer: Self-pay | Admitting: Pulmonary Disease

## 2016-07-11 NOTE — Progress Notes (Signed)
This encounter was created in error - please disregard.

## 2017-08-07 ENCOUNTER — Ambulatory Visit (INDEPENDENT_AMBULATORY_CARE_PROVIDER_SITE_OTHER): Payer: BC Managed Care – PPO | Admitting: Internal Medicine

## 2017-08-07 VITALS — BP 144/80 | HR 60 | Ht 74.0 in | Wt 239.0 lb

## 2017-08-07 DIAGNOSIS — I459 Conduction disorder, unspecified: Secondary | ICD-10-CM

## 2017-08-07 NOTE — Progress Notes (Signed)
      Patient Care Team: Patient, [name] as PCP - General (General Practice)   HPI  Cameron Huerta is a 61 y.o. male Seen to reestablish cardiac care.   Remotely he underwent catheter ablation for Atrial flutter-- no recurrent arrhythmia  2015 hospitalized for pulmonary embolism--Treated with  Rivaroxaban  Plan is for indefinite therapy  He had an episode of weakness that occurred while doing yoga. It lasted for about 30 minutes. this prompted him to go to urgent care where electrocardiogram demonstrated Mobitz 1   No dizziness no syncope     Past Medical History:  Diagnosis Date  . First degree AV block   . History of nuclear stress test    Abnormal  . Pulmonary embolism 10/2013  . Syncope    after abrupt cessation of exercise  . Typical atrial flutter    Mali score - 0    No past surgical history on file.  Current Outpatient Prescriptions  Medication Sig Dispense Refill  . losartan (COZAAR) 50 MG tablet losartan 50 mg tablet    . Multiple Vitamin (MULTI VITAMIN MENS PO) Take by mouth daily.    . rivaroxaban (XARELTO) 20 MG TABS tablet Take 1 tablet (20 mg total) by mouth daily with supper. 30 tablet 0   No current facility-administered medications for this visit.     Allergies  Allergen Reactions  . Scallops [Shellfish Allergy] Itching and Nausea And Vomiting    Review of Systems negative except from HPI and PMH  Physical Exam BP (!) 144/80   Pulse 60   Ht 6\' 2"  (1.88 m)   Wt 239 lb (108.4 kg)   BMI 30.69 kg/m  Well developed and well nourished in no acute distress HENT normal E scleral and icterus clear Neck Supple JVP flat; carotids brisk and full Clear to ausculation  Regular rate and rhythm, no murmurs gallops or rub Soft with active bowel sounds No clubbing cyanosis none Edema Alert and oriented, grossly normal motor and sensory function Skin Warm and Dry  ECG demonstrates sinus rhythm at 61 Intervals 29/10/39 Right axis  deviation>>136 ECG 9/18 was also personally reviewed demonstrating Mobitz 1 heart block  Assessment and  Plan 1AVB   2avb1  DVT  The patient has progressive conduction system disease. There is   currently no evidence of clinical consequences to this.  Hence, we will observe. We'll plan to undertake an echocardiogram to look at left ventricular function left atrial structure.  I am struck by the degree of conduction system disease is relatively young man.

## 2017-08-07 NOTE — Patient Instructions (Signed)
Your physician recommends that you continue on your current medications as directed. Please refer to the Current Medication list given to you today.  Your physician has requested that you have an exercise tolerance test. For further information please visit [email or web address]. Please also follow instruction sheet, as given.   

## 2017-08-18 ENCOUNTER — Encounter: Payer: Self-pay | Admitting: Psychiatry

## 2018-05-26 ENCOUNTER — Emergency Department (HOSPITAL_COMMUNITY): Payer: BC Managed Care – PPO

## 2018-05-26 ENCOUNTER — Emergency Department (HOSPITAL_COMMUNITY)
Admission: EM | Admit: 2018-05-26 | Discharge: 2018-05-26 | Discharge status: Against Medical Advice | Payer: BC Managed Care – PPO | Attending: Emergency Medicine | Admitting: Emergency Medicine

## 2018-05-26 ENCOUNTER — Encounter (HOSPITAL_COMMUNITY): Payer: Self-pay | Admitting: *Deleted

## 2018-05-26 DIAGNOSIS — I441 Atrioventricular block, second degree: Secondary | ICD-10-CM | POA: Diagnosis not present

## 2018-05-26 DIAGNOSIS — Z7901 Long term (current) use of anticoagulants: Secondary | ICD-10-CM | POA: Diagnosis not present

## 2018-05-26 DIAGNOSIS — Z79899 Other long term (current) drug therapy: Secondary | ICD-10-CM | POA: Insufficient documentation

## 2018-05-26 DIAGNOSIS — N200 Calculus of kidney: Secondary | ICD-10-CM | POA: Diagnosis not present

## 2018-05-26 DIAGNOSIS — R1032 Left lower quadrant pain: Secondary | ICD-10-CM | POA: Diagnosis present

## 2018-05-26 LAB — BASIC METABOLIC PANEL
Anion gap: 11 (ref 5–15)
BUN: 11 mg/dL (ref 8–23)
CALCIUM: 9.2 mg/dL (ref 8.9–10.3)
CO2: 28 mmol/L (ref 22–32)
CREATININE: 1.21 mg/dL (ref 0.61–1.24)
Chloride: 101 mmol/L (ref 98–111)
GFR calc Af Amer: >60 mL/min (ref >60)
GFR calc non Af Amer: >60 mL/min (ref >60)
Glucose, Bld: 93 mg/dL (ref 70–99)
Potassium: 4.1 mmol/L (ref 3.5–5.1)
SODIUM: 140 mmol/L (ref 135–145)

## 2018-05-26 LAB — URINALYSIS, ROUTINE W REFLEX MICROSCOPIC
BILIRUBIN URINE: NEGATIVE
Bacteria, UA: NONE SEEN
GLUCOSE, UA: NEGATIVE mg/dL
KETONES UR: NEGATIVE mg/dL
Nitrite: NEGATIVE
PROTEIN: 100 mg/dL — AB
Specific Gravity, Urine: 1.015 (ref 1.005–1.030)
pH: 9 — ABNORMAL HIGH (ref 5.0–8.0)

## 2018-05-26 LAB — CBC WITH DIFFERENTIAL/PLATELET
Basophils Absolute: 0 10*3/uL (ref 0.0–0.1)
Basophils Relative: 0 %
EOS ABS: 0.2 10*3/uL (ref 0.0–0.7)
Eosinophils Relative: 2 %
HCT: 44.8 % (ref 39.0–52.0)
Hemoglobin: 14.5 g/dL (ref 13.0–17.0)
LYMPHS ABS: 1.9 10*3/uL (ref 0.7–4.0)
Lymphocytes Relative: 24 %
MCH: 29.3 pg (ref 26.0–34.0)
MCHC: 32.4 g/dL (ref 30.0–36.0)
MCV: 90.5 fL (ref 78.0–100.0)
Monocytes Absolute: 0.6 10*3/uL (ref 0.1–1.0)
Monocytes Relative: 7 %
NEUTROS ABS: 5.4 10*3/uL (ref 1.7–7.7)
Neutrophils Relative %: 67 %
Platelets: 210 10*3/uL (ref 150–400)
RBC: 4.95 MIL/uL (ref 4.22–5.81)
RDW: 13.6 % (ref 11.5–15.5)
WBC: 8.1 10*3/uL (ref 4.0–10.5)

## 2018-05-26 MED ORDER — CEPHALEXIN 500 MG PO CAPS: 500.0000 mg | ORAL_CAPSULE | Freq: Four times a day (QID) | ORAL | 0 refills | Status: DC

## 2018-05-26 MED ORDER — TAMSULOSIN HCL 0.4 MG PO CAPS: 0.4000 mg | ORAL_CAPSULE | Freq: Every day | ORAL | 30 refills | Status: DC

## 2018-05-26 MED ORDER — KETOROLAC TROMETHAMINE 15 MG/ML IJ SOLN: 15.0000 mg | Freq: Once | INTRAMUSCULAR | Status: DC

## 2018-05-26 NOTE — ED Provider Notes (Cosign Needed)
Patient placed in Quick Look pathway, seen and evaluated   Chief Complaint: knee pain for 3 weeks,  Left groin pain  HPI:   Pt strained knee and groin 3 weeks ago.  Pt now having pain in left lower abdomen  ROS: no fever,   Physical Exam:   Gen: No distress  Neuro: Awake and Alert  Skin: Warm    Focused Exam: Lungs normal resp rate Heart RRr   Initiation of care has begun. The patient has been counseled on the process, plan, and necessity for staying for the completion/evaluation, and the remainder of the medical screening examination   Fransico Meadow, PA-C 05/26/18 1507

## 2018-05-26 NOTE — Discharge Instructions (Signed)
You were seen in the emergency department today and found to have a kidney stone.  This is in the left ureter.  We are giving you Flomax, a medicine to help pass the stone.  Please take this daily with a meal.   We are also starting you on Keflex, an antibiotic, to help with potential infection as her urine had some white blood cells in it.  Please take Keflex 4 times per day for the next 7 days.  We have prescribed you new medication(s) today. Discuss the medications prescribed today with your pharmacist as they can have adverse effects and interactions with your other medicines including over the counter and prescribed medications. Seek medical evaluation if you start to experience new or abnormal symptoms after taking one of these medicines, seek care immediately if you start to experience difficulty breathing, feeling of your throat closing, facial swelling, or rash as these could be indications of a more serious allergic reaction  Please take over-the-counter Tylenol per over-the-counter dosing for discomfort.  Regarding your kidney stones we would like you to follow-up with the urologist regarding your discharge instructions.  Please call tomorrow in order to make an appointment in the next 3 days.  Regarding your 2nd degree heart block, we would like you to follow-up extremely closely with Dr. Caryl Comes, please call the office tomorrow morning in order to make an appointment.  You are leaving the [facility], you may return to the emergency department at any time should you wish to seek reevaluation.  Follow with the above specialist as discussed.  Please take your medication as prescribed.  Return to the ER anytime for new or worsening symptoms or any other concerns.

## 2018-05-26 NOTE — ED Notes (Signed)
ED Provider at bedside. 

## 2018-05-26 NOTE — ED Triage Notes (Signed)
Pt in stating he thinks he pulled a groin muscle a few weeks ago while walking, pain to the right side initially, today he noticed pain to the left side of his groin that radiated into his testicle

## 2018-05-26 NOTE — ED Notes (Signed)
Patient transported to CT 

## 2018-05-26 NOTE — ED Provider Notes (Signed)
[facility] EMERGENCY DEPARTMENT Provider Note   CSN: [id number] Arrival date & time: 05/26/18  1403    History   Chief Complaint Chief Complaint  Patient presents with  . Groin Pain    HPI NYQUAN SELBE is a 62 y.o. male with a history of pulmonary embolism on xarelto, second-degree AV block, and hyperlipidemia who presents to the ED for L groin pain that started today. Patient states that about 3 weeks ago he was running/power walking when he experienced a pulling sensation in the R groin. He states this gradually improved and only would bother him if he re-irritated it with certain exercises of movements. Patient states that this AM he woke up with pain in the L groin/suprapbuic area, none on the R side. He states that this feels somewhat similar to prior R side issue, but was not aggravated by exercise and radiates into the testicle. Pain is intermittent, mostly with movement/twisting/turning or with palpation. No other specific alleviating/aggravating factors. No intervention PTA. He reports his urine did appear darker today, but he has not necessarily had much water. Denies fever, chills, nausea, vomiting, testicular swelling, dysuria, urgency, frequency, or penile discharge.    HPI  Past Medical History:  Diagnosis Date  . First degree AV block   . History of nuclear stress test    Abnormal  . Pulmonary embolism ([place]) 10/2013  . Syncope    after abrupt cessation of exercise  . Typical atrial flutter ([facility])    Mali score - 0    Patient Active Problem List   Diagnosis Date Noted  . Pulmonary embolism, bilateral ([place]) 10/21/2013  . Atrial flutter ([place]) 06/14/2012  . Hyperlipemia 06/14/2012    History reviewed. No pertinent surgical history.      Home Medications    Prior to Admission medications   Medication Sig Start Date End Date Taking? Authorizing Provider  losartan (COZAAR) 50 MG tablet losartan 50 mg tablet    [provider]    Multiple Vitamin (MULTI VITAMIN MENS PO) Take by mouth daily.    [provider]  rivaroxaban (XARELTO) 20 MG TABS tablet Take 1 tablet (20 mg total) by mouth daily with supper. 05/08/15   Juanito Doom, MD    Family History Family History  Problem Relation Age of Onset  . Hypertension Father   . Heart attack Father     Social History Social History   Tobacco Use  . Smoking status: Never Smoker  . Smokeless tobacco: Never Used  Substance Use Topics  . Alcohol use: No  . Drug use: No     Allergies   Scallops [shellfish allergy]   Review of Systems Review of Systems  Constitutional: Negative for chills and fever.  Respiratory: Negative for shortness of breath.   Cardiovascular: Negative for chest pain and leg swelling.  Gastrointestinal: Positive for abdominal distention (surpapubic). Negative for blood in stool, constipation, diarrhea, nausea and vomiting.  Genitourinary: Positive for hematuria ("darker") and testicular pain. Negative for difficulty urinating, dysuria, flank pain, frequency, penile pain, penile swelling, scrotal swelling and urgency.  All other systems reviewed and are negative.    Physical Exam Updated Vital Signs BP (!) 151/68 (BP Location: Right Arm)   Pulse (!) 55   Temp 98 F (36.7 C) (Oral)   Resp 20   SpO2 100%   Physical Exam  Constitutional: He appears well-developed and well-nourished.  Non-toxic appearance. No distress.  HENT:  Head: Normocephalic and atraumatic.  Eyes: Conjunctivae  are normal. Right eye exhibits no discharge. Left eye exhibits no discharge.  Neck: Neck supple.  Cardiovascular: Normal rate and regular rhythm.  Pulmonary/Chest: Effort normal and breath sounds normal. No respiratory distress. He has no wheezes. He has no rhonchi. He has no rales.  Respiration even and unlabored  Abdominal: Soft. He exhibits no distension. There is tenderness (L suprapbuic area) in the suprapubic area. There is no  rigidity, no rebound, no guarding and no CVA tenderness. Hernia confirmed negative in the right inguinal area and confirmed negative in the left inguinal area.  Genitourinary: Cremasteric reflex is present. Right testis shows no mass, no swelling and no tenderness. Left testis shows no mass, no swelling and no tenderness. Circumcised. No penile erythema or penile tenderness. No discharge found.  Genitourinary Comments: EDT present as chaperone.   Lymphadenopathy: No inguinal adenopathy noted on the right or left side.  Neurological: He is alert.  Clear speech.   Skin: Skin is warm and dry. No rash noted.  Psychiatric: He has a normal mood and affect. His behavior is normal.  Nursing note and vitals reviewed.   ED Treatments / Results  Labs (all labs ordered are listed, but only abnormal results are displayed) Labs Reviewed  URINALYSIS, ROUTINE W REFLEX MICROSCOPIC - Abnormal; Notable for the following components:      Result Value   APPearance CLOUDY (*)    pH 9.0 (*)    Hgb urine dipstick LARGE (*)    Protein, ur 100 (*)    Leukocytes, UA LARGE (*)    RBC / HPF >50 (*)    All other components within normal limits  URINE CULTURE  CBC WITH DIFFERENTIAL/PLATELET  BASIC METABOLIC PANEL   EKG EKG Interpretation  Date/Time:  Tuesday May 26 2018 20:49:55 EDT Ventricular Rate:  37 PR Interval:    QRS Duration: 93 QT Interval:  432 QTC Calculation: 339 R Axis:   -106 Text Interpretation:  Predominant 2:1 AV block Left anterior fascicular block Abnormal R-wave progression, late transition changed from prior ecg bradycardia and block are new as compared to Jan 2015 Confirmed by Jola Schmidt [phone]) on 05/27/2018 6:26:41 PM   Radiology Ct Renal Stone Study  Result Date: 05/26/2018 CLINICAL DATA:  Left lower quadrant pain EXAM: CT ABDOMEN AND PELVIS WITHOUT CONTRAST TECHNIQUE: Multidetector CT imaging of the abdomen and pelvis was performed following the standard protocol without  IV contrast. COMPARISON:  None. FINDINGS: Lower chest: Lung bases show no acute consolidation or pleural effusion. Heart size within normal limits Hepatobiliary: Multiple calcified gallstone. No focal hepatic abnormality. No biliary dilatation Pancreas: Unremarkable. No pancreatic ductal dilatation or surrounding inflammatory changes. Spleen: Normal in size without focal abnormality. Adrenals/Urinary Tract: Adrenal glands are normal. Mild left hydronephrosis and proximal hydroureter. 4 x 5 mm stone in the proximal left ureter at the L4 level. Bladder is normal Stomach/Bowel: Stomach is within normal limits. Appendix appears normal. No evidence of bowel wall thickening, distention, or inflammatory changes. Vascular/Lymphatic: Mild aortic atherosclerosis. No aneurysm. No significantly enlarged lymph nodes Reproductive: Enlarged prostate Other: Negative for free air or free fluid Musculoskeletal: Degenerative changes of the spine. No acute or suspicious abnormality. IMPRESSION: 1. Mild left hydronephrosis and proximal hydroureter, secondary to a 4 x 5 mm stone in the proximal left ureter at the L4 level. 2. Multiple calcified gallstones Electronically Signed   By: Donavan Foil M.D.   On: 05/26/2018 19:42    Procedures Procedures (including critical care time)  Medications Ordered in ED  Medications - No data to display   Initial Impression / Assessment and Plan / ED Course  I have reviewed the triage vital signs and the nursing notes.  Pertinent labs & imaging results that were available during my care of the patient were reviewed by me and considered in my medical decision making (see chart for details).    Patient presents to the ED with complaints of L groin pain. Patient nontoxic appearing, intermittently bradycardic and hypertensive, do not suspect HTN emergency.  On exam patient is tender to the L suprapubic area, no peritoneal signs, no testicular tenderness. DDX: nephrolithiasis,  pyelonephritis/UTI, cholecystitis, pancreatitis, bowel obstruction/perforation, muscle strain, feel nephrolithiasis is most likely at this time will evaluate with labs and CT renal study.   Regarding going pain assessment: No leukocytosis, anemia, or significant electrolyte derangements. CT renal study with mild left hydronephrosis and proximal hydroureter, secondary to a 4 x 5 mm stone in the proximal left ureter at the L4 level. Renal function preserved. Urinalysis with hematuria as well as large amount of leukocytes, nitrite negative, no bacteria, will culture and place on keflex. Patient tolerating PO in the ER with pain well controlled. Will prescribe  Flomax, offered stronger analgesics, patient declined, with urology follow up. NSAIDs avoided given on Xarelto.  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 Caryl Comes, EKG today with Mobitz 1 with  rate in the 30s, there has been discussion of pacemaker, but it was not felt to be necessary by his cardiologist. Asymptomatic in the department. Orthostatic negative. Prior EKGs have shown HR in the 60s, today intermittently in the 30s- will discuss with cardiology. Dr. Roderic Palau in agreement. Discussed with cardiologist Dr. Radford Pax- recommends medical admission for observation and EP consultation in the AM. Discussed this with patient who is adamantly refusing.  Had formal AMA discussion with the patient. The patient and I discussed the nature and purpose, risks and benefits, as well as, the alternatives of treatment. Time was given to allow the opportunity to ask questions and consider options. After the discussion, the patient decided to refuse the offerred treatment. The patient was informed that refusal could lead to, but was not limited to, death, permanent disability, or severe pain. If present, I asked the relatives and/or significant others to dissuade the patient  without success. Prior to refusing, I determined that the patient had the capacity to make their decision and understood the consequences of that decision. After refusal, I made every reasonable effort to treat them to the best of my ability.  The patient was notified that they may return to the emergency department at any time for further treatment.  Patient signed out [place].   Patient discharged home with Flomax with close urology and cardiology follow-up. I discussed results, treatment plan, need for follow-up, and return precautions with the patient and his wife at bedside.. Provided opportunity for questions, patient and his wife confirmed understanding and are in agreement with plan.   Findings and plan of care discussed with supervising physician Dr. Roderic Palau who personally evaluated and examined this patient and is in agreement.    Final Clinical Impressions(s) / ED Diagnoses   Final diagnoses:  Kidney stone  AV block, Mobitz 1    ED Discharge Orders         Ordered    tamsulosin (FLOMAX) 0.4 MG CAPS capsule  Daily after supper     05/26/18  2156    cephALEXin (KEFLEX) 500 MG capsule  4 times daily     05/26/18 2157           Amaryllis Dyke, PA-C 05/28/18 0016    Milton Ferguson, MD 05/28/18 [phone]

## 2018-05-27 ENCOUNTER — Telehealth: Payer: Self-pay | Admitting: Internal Medicine

## 2018-05-27 NOTE — Telephone Encounter (Signed)
New Message:      Pt is calling and states he went to the ER last night and he states they told him that his HR was lower than his resting rate and he told them he would follow up with Caryl Comes asap.

## 2018-05-27 NOTE — Telephone Encounter (Signed)
Pt presented in ER yesterday for groin and kidney pain. Upon assessment, pt was found to be in Type II Mobitz with a HR of 37. Pt was strongly advised to be admitted and evaluated by EP but pt left AMA. Today, pt understands he needed to stay for further evaluation. Fortunately, he agreed to see Dr Caryl Comes in [facility] for further evaluation. PPM implant has been discussed in the past. Pt understands this may be a possibility for him at this time. Currently, pt denies any symptoms related to his decreased HR and agrees to see Dr Caryl Comes in [place].

## 2018-05-28 ENCOUNTER — Encounter: Payer: Self-pay | Admitting: Internal Medicine

## 2018-05-28 ENCOUNTER — Ambulatory Visit: Payer: BC Managed Care – PPO | Admitting: Internal Medicine

## 2018-05-28 VITALS — BP 132/80 | HR 47 | Ht 74.0 in | Wt 236.0 lb

## 2018-05-28 DIAGNOSIS — I441 Atrioventricular block, second degree: Secondary | ICD-10-CM | POA: Diagnosis not present

## 2018-05-28 NOTE — Progress Notes (Signed)
Patient Care Team: Patient, [name] as PCP - General (General Practice)   HPI  Cameron Huerta is a 62 y.o. male Seen in followup for recent eR visit where he was found to have 2: 1 block  Remotely he underwent catheter ablation for Atrial flutter-- no recurrent arrhythmia  2015 hospitalized for pulmonary embolism--Treated with  Rivaroxaban  Plan is for indefinite therapy  He had an episode of weakness that occurred while doing yoga. It lasted for about 30 minutes. this prompted him to go to urgent care where electrocardiogram demonstrated Mobitz 1;  8/19 he was seen in the ER for presumed kidney stones.  He was found to be in 2: 1 heart block records reviewed  Previous recommendations for stress testing to look for rate effects on conduction was not consummated  DATE TEST EF   1/15 Echo   60-65 %           The patient denies chest pain, shortness of breath, nocturnal dyspnea, orthopnea or peripheral edema.  There have been no palpitations, lightheadedness or syncope.   He apparent  Past Medical History:  Diagnosis Date  . First degree AV block   . History of nuclear stress test    Abnormal  . Pulmonary embolism ([place]) 10/2013  . Syncope    after abrupt cessation of exercise  . Typical atrial flutter ([facility])    Mali score - 0    History reviewed. No pertinent surgical history.  Current Outpatient Medications  Medication Sig Dispense Refill  . losartan (COZAAR) 50 MG tablet Take 50 mg by mouth daily.     . Multiple Vitamin (MULTI VITAMIN MENS PO) Take 1 tablet by mouth daily.     . rivaroxaban (XARELTO) 20 MG TABS tablet Take 1 tablet (20 mg total) by mouth daily with supper. 30 tablet 0  . tamsulosin (FLOMAX) 0.4 MG CAPS capsule Take 1 capsule (0.4 mg total) by mouth daily after supper. 30 capsule 30   No current facility-administered medications for this visit.     Allergies  Allergen Reactions  . Morphine And Related Other (See Comments)  . Scallops  [Shellfish Allergy] Itching, Nausea And Vomiting and Swelling    GI upset, also    Review of Systems negative except from HPI and PMH  Physical Exam BP 132/80 (BP Location: Left Arm, Patient Position: Sitting, Cuff Size: Large)   Pulse (!) 47   Ht 6\' 2"  (1.88 m)   Wt 236 lb (107 kg)   BMI 30.30 kg/m  Well developed and nourished in no acute distress HENT normal Neck supple with JVP-flat Clear Regular rate and rhythm, no murmurs or gallops Abd-soft with active BS No Clubbing cyanosis edema Skin-warm and dry A & Oriented  Grossly normal sensory and motor function  ECG demonstrates 2:1 Heart block with 2 episodes of 3:2 heart block  Assessment and  Plan  1AVB   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  Amyloid comes to mind.  He has no symptoms.  With it being evidence of Wenckebach, no indication for pacing in the absence of symptoms.  He is advised to not be a minimalist in the event of transient lightheadedness.  He is here with his wife and she understands as well.  We spent more than 50% of our >25 min visit in face to face counseling regarding the above

## 2018-05-28 NOTE — Telephone Encounter (Signed)
Patient seen in clinic today with Dr. Caryl Comes.

## 2018-05-28 NOTE — Patient Instructions (Signed)
Medication Instructions: - Your physician recommends that you continue on your current medications as directed. Please refer to the Current Medication list given to you today.  Labwork: - none ordered  Procedures/Testing: - Your physician has requested that you have an echocardiogram. Echocardiography is a painless test that uses sound waves to create images of your heart. It provides your doctor with information about the size and shape of your heart and how well your heart's chambers and valves are working. This procedure takes approximately one hour. There are no restrictions for this procedure. An IV may need to be started during the exam to inject an image enhancer if needed. Please come to this appointment well hydrated.  Follow-Up: - Your physician wants you to follow-up in: 1 year with Dr. Caryl Comes. You will receive a reminder letter in the mail two months in advance. If you don't receive a letter, please call our office to schedule the follow-up appointment.   Any Additional Special Instructions Will Be Listed Below (If Applicable).     If you need a refill on your cardiac medications before your next appointment, please call your pharmacy.

## 2018-05-29 LAB — URINE CULTURE: Culture: >=100000 — AB

## 2018-05-30 ENCOUNTER — Telehealth: Payer: Self-pay

## 2018-05-30 NOTE — Telephone Encounter (Signed)
Post ED Visit - Positive Culture Follow-up  Culture report reviewed by antimicrobial stewardship pharmacist:  []  Elenor Quinones, Pharm.D. []  Heide Guile, Pharm.D., BCPS AQ-ID []  Parks Neptune, Pharm.D., BCPS []  Alycia Rossetti, Pharm.D., BCPS []  [place], [place].D., BCPS, AAHIVP []  Legrand Como, Pharm.D., BCPS, AAHIVP []  Salome Arnt, PharmD, BCPS []  Johnnette Gourd, PharmD, BCPS []  Hughes Better, PharmD, BCPS []  Leeroy Cha, PharmD H [facility]id Pharm D Positive urine culture Treated with Cephalexin, organism sensitive to the same and no further patient follow-up is required at this time.  Genia Del 05/30/2018, 11:10 AM

## 2018-06-04 ENCOUNTER — Ambulatory Visit (INDEPENDENT_AMBULATORY_CARE_PROVIDER_SITE_OTHER): Payer: BC Managed Care – PPO

## 2018-06-04 ENCOUNTER — Other Ambulatory Visit: Payer: Self-pay

## 2018-06-04 DIAGNOSIS — I441 Atrioventricular block, second degree: Secondary | ICD-10-CM | POA: Diagnosis not present

## 2018-08-28 ENCOUNTER — Telehealth: Payer: Self-pay | Admitting: *Deleted

## 2018-08-28 NOTE — Telephone Encounter (Signed)
Please note Xarelto is followed by Dr. Lake Bells not [facility], please check with pulmonary.

## 2018-08-28 NOTE — Telephone Encounter (Signed)
Pharm please address xarelto

## 2018-08-28 NOTE — Telephone Encounter (Signed)
Pt takes Xarelto for massive unprovoked bilateral PE in January 2015 with plan for indefinite therapy. Xarelto being prescribed by Dr Lake Bells - recommend that he provide clearance since he is managing patient's hypercoagulability.

## 2018-08-28 NOTE — Telephone Encounter (Signed)
   [facility] Pre-operative Risk Assessment    Request for surgical clearance:  1. What type of surgery is being performed? COLONOSCOPY   2. When is this surgery scheduled? 09/22/18   3. Are there any medications that need to be held prior to surgery and how long? XARELTO    4. Practice name and name of physician performing surgery? [facility]; DR. HUNG   5. What is your office phone and fax number? Ph# [phone]; FAX # [phone]   6. Anesthesia type (None, local, MAC, general) ? PROPOFOL   Julaine Hua 08/28/2018, 8:34 AM  _________________________________________________________________   (provider comments below)

## 2018-09-04 ENCOUNTER — Other Ambulatory Visit: Payer: Self-pay | Admitting: Gastroenterology

## 2018-11-13 ENCOUNTER — Encounter (HOSPITAL_COMMUNITY): Admission: RE | Payer: Self-pay | Source: Home / Self Care

## 2018-11-13 ENCOUNTER — Ambulatory Visit (HOSPITAL_COMMUNITY)
Admission: RE | Admit: 2018-11-13 | Payer: BC Managed Care – PPO | Source: Home / Self Care | Admitting: Gastroenterology

## 2018-11-13 SURGERY — COLONOSCOPY WITH PROPOFOL
Anesthesia: Monitor Anesthesia Care

## 2018-12-18 ENCOUNTER — Ambulatory Visit (HOSPITAL_COMMUNITY): Payer: BC Managed Care – PPO | Admitting: Certified Registered Nurse Anesthetist

## 2018-12-18 ENCOUNTER — Encounter (INDEPENDENT_AMBULATORY_CARE_PROVIDER_SITE_OTHER): Payer: Self-pay

## 2018-12-18 ENCOUNTER — Encounter (HOSPITAL_COMMUNITY): Payer: Self-pay | Admitting: *Deleted

## 2018-12-18 ENCOUNTER — Ambulatory Visit (HOSPITAL_COMMUNITY)
Admission: RE | Admit: 2018-12-18 | Discharge: 2018-12-18 | Disposition: A | Discharge status: Home / Self Care | Payer: BC Managed Care – PPO | Attending: Gastroenterology | Admitting: Gastroenterology

## 2018-12-18 ENCOUNTER — Other Ambulatory Visit: Payer: Self-pay

## 2018-12-18 ENCOUNTER — Encounter (HOSPITAL_COMMUNITY)
Admission: RE | Disposition: A | Discharge status: Home / Self Care | Payer: Self-pay | Source: Home / Self Care | Attending: Gastroenterology

## 2018-12-18 DIAGNOSIS — Z885 Allergy status to narcotic agent status: Secondary | ICD-10-CM | POA: Insufficient documentation

## 2018-12-18 DIAGNOSIS — Z79899 Other long term (current) drug therapy: Secondary | ICD-10-CM | POA: Diagnosis not present

## 2018-12-18 DIAGNOSIS — Z7901 Long term (current) use of anticoagulants: Secondary | ICD-10-CM | POA: Diagnosis not present

## 2018-12-18 DIAGNOSIS — Z1211 Encounter for screening for malignant neoplasm of colon: Secondary | ICD-10-CM | POA: Diagnosis not present

## 2018-12-18 DIAGNOSIS — D120 Benign neoplasm of cecum: Secondary | ICD-10-CM | POA: Insufficient documentation

## 2018-12-18 DIAGNOSIS — Z791 Long term (current) use of non-steroidal anti-inflammatories (NSAID): Secondary | ICD-10-CM | POA: Insufficient documentation

## 2018-12-18 HISTORY — PX: COLONOSCOPY WITH PROPOFOL: SHX5780

## 2018-12-18 HISTORY — PX: POLYPECTOMY: SHX5525

## 2018-12-18 SURGERY — COLONOSCOPY WITH PROPOFOL
Anesthesia: Monitor Anesthesia Care

## 2018-12-18 MED ORDER — LACTATED RINGERS IV SOLN
INTRAVENOUS | Status: DC
  Administered 2018-12-18: 08:00:00 via INTRAVENOUS

## 2018-12-18 MED ORDER — PROPOFOL 10 MG/ML IV BOLUS
INTRAVENOUS | Status: DC | PRN
  Administered 2018-12-18: 50 mg via INTRAVENOUS

## 2018-12-18 MED ORDER — PROPOFOL 10 MG/ML IV BOLUS
INTRAVENOUS | Status: AC
  Filled 2018-12-18: qty 60

## 2018-12-18 MED ORDER — PROPOFOL 500 MG/50ML IV EMUL
INTRAVENOUS | Status: DC | PRN
  Administered 2018-12-18: 75 ug/kg/min via INTRAVENOUS

## 2018-12-18 SURGICAL SUPPLY — 21 items

## 2018-12-18 NOTE — Transfer of Care (Signed)
Immediate Anesthesia Transfer of Care Note  Patient: Cameron Huerta  Procedure(s) Performed: COLONOSCOPY WITH PROPOFOL (N/A ) POLYPECTOMY  Patient Location: PACU and Endoscopy Unit  Anesthesia Type:MAC  Level of Consciousness: awake, oriented, drowsy and patient cooperative  Airway & Oxygen Therapy: Patient Spontanous Breathing and Patient connected to face mask oxygen  Post-op Assessment: Report given to RN, Post -op Vital signs reviewed and stable and Patient moving all extremities  Post vital signs: Reviewed and stable  Last Vitals:  Vitals Value Taken Time  BP    Temp    Pulse 33 12/18/2018  9:19 AM  Resp 18 12/18/2018  9:19 AM  SpO2 100 % 12/18/2018  9:19 AM  Vitals shown include unvalidated device data.  Last Pain:  Vitals:   12/18/18 0824  TempSrc: Oral  PainSc: 0-No pain         Complications: No apparent anesthesia complications

## 2018-12-18 NOTE — Op Note (Signed)
[facility] Patient Name: Cameron Huerta Procedure Date: 12/18/2018 MRN: [id number] Attending MD: Carol Ada , MD Date of Birth: 23-Apr-1956 CSN: [id number] Age: 63 Admit Type: Outpatient Procedure:                Colonoscopy Indications:              Screening for colorectal malignant neoplasm Providers:                Carol Ada, MD, Cleda Daub, RN, Cherylynn Ridges,                            Technician, Dion Saucier, CRNA Referring MD:              Medicines:                Propofol per Anesthesia Complications:            No immediate complications. Estimated Blood Loss:     Estimated blood loss: none. Procedure:                Pre-Anesthesia Assessment:                           - Prior to the procedure, a History and Physical                            was performed, and patient medications and                            allergies were reviewed. The patient's tolerance of                            previous anesthesia was also reviewed. The risks                            and benefits of the procedure and the sedation                            options and risks were discussed with the patient.                            All questions were answered, and informed consent                            was obtained. Prior Anticoagulants: The patient has                            taken Xarelto (rivaroxaban), last dose was 2 days                            prior to procedure. ASA Grade Assessment: III - A                            patient with severe systemic disease. After  reviewing the risks and benefits, the patient was                            deemed in satisfactory condition to undergo the                            procedure.                           - Sedation was administered by an anesthesia                            professional. Deep sedation was attained.                           After obtaining informed consent, the  colonoscope                            was passed under direct vision. Throughout the                            procedure, the patient's blood pressure, pulse, and                            oxygen saturations were monitored continuously. The                            CF-[id number] ([id number] Olympus colonoscope was                            introduced through the anus and advanced to the the                            cecum, identified by appendiceal orifice and                            ileocecal valve. The colonoscopy was performed                            without difficulty. The patient tolerated the                            procedure well. The quality of the bowel                            preparation was excellent. The ileocecal valve,                            appendiceal orifice, and rectum were photographed. Scope In: 8:50:01 AM Scope Out: 9:11:47 AM Scope Withdrawal Time: 0 hours 16 minutes 11 seconds  Total Procedure Duration: 0 hours 21 minutes 46 seconds  Findings:      A 1 mm polyp was found in the cecum. The polyp was sessile. The polyp       was removed with a cold snare. Resection and retrieval  were complete. Impression:               - One 1 mm polyp in the cecum, removed with a cold                            snare. Resected and retrieved. Moderate Sedation:      Not Applicable - Patient had care per Anesthesia. Recommendation:           - Patient has a contact number available for                            emergencies. The signs and symptoms of potential                            delayed complications were discussed with the                            patient. Return to normal activities tomorrow.                            Written discharge instructions were provided to the                            patient.                           - Resume previous diet.                           - Continue present medications.                           - Await pathology  results.                           - Repeat colonoscopy in 10 years for surveillance. Procedure Code(s):        --- Professional ---                           [phone], Colonoscopy, flexible; with removal of                            tumor(s), polyp(s), or other lesion(s) by snare                            technique Diagnosis Code(s):        --- Professional ---                           Z12.11, Encounter for screening for malignant                            neoplasm of colon                           D12.0, Benign neoplasm of cecum CPT copyright 2018 [employer]. All rights  reserved. The codes documented in this report are preliminary and upon coder review may  be revised to meet current compliance requirements. Carol Ada, MD Carol Ada, MD 12/18/2018 9:17:55 AM This report has been signed electronically. Number of Addenda: 0

## 2018-12-18 NOTE — H&P (Signed)
  [place] HPI: At this time the patient denies any problems with nausea, vomiting, fevers, chills, abdominal pain, diarrhea, constipation, hematochezia, melena, GERD, or dysphagia. The patient denies any known family history of colon cancers. No complaints of chest pain, SOB, MI, or sleep apnea. Four to five years ago he suffered with a PE of unknown origin. Xarelto was started.   Past Medical History:  Diagnosis Date  . First degree AV block   . History of nuclear stress test    Abnormal  . Pulmonary embolism ([place]) 10/2013  . Syncope    after abrupt cessation of exercise  . Typical atrial flutter (HCC)    Mali score - 0    History reviewed. No pertinent surgical history.  Family History  Problem Relation Age of Onset  . Hypertension Father   . Heart attack Father     Social History:  reports that he has never smoked. He has never used smokeless tobacco. He reports that he does not drink alcohol or use drugs.  Allergies:  Allergies  Allergen Reactions  . Morphine And Related Other (See Comments)  . Scallops [Shellfish Allergy] Itching, Nausea And Vomiting and Swelling    GI upset, also    Medications:  Scheduled:  Continuous: . lactated ringers      No results found for this or any previous visit (from the past 24 hour(s)).   No results found.  ROS:  As stated above in the HPI otherwise negative.  Pulse 60, temperature 97.6 F (36.4 C), temperature source Oral, resp. rate 16, height 6\' 2"  (1.88 m), weight 106.6 kg.    PE: Gen: NAD, Alert and Oriented HEENT:  [place]/AT, EOMI Neck: Supple, no LAD Lungs: CTA Bilaterally CV: RRR without M/G/R ABM: Soft, NTND, +BS Ext: No C/C/E  Assessment/Plan: 1) Screening colonoscopy.  Biana Haggar D 12/18/2018, 8:12 AM

## 2018-12-18 NOTE — Anesthesia Preprocedure Evaluation (Signed)
Anesthesia Evaluation  Patient identified by MRN, date of birth, ID band Patient awake    Reviewed: Allergy & Precautions, NPO status , Patient's Chart, lab work & pertinent test results  Airway Mallampati: I  TM Distance: >3 FB Neck ROM: Full    Dental   Pulmonary    Pulmonary exam normal        Cardiovascular Normal cardiovascular exam     Neuro/Psych    GI/Hepatic   Endo/Other    Renal/GU      Musculoskeletal   Abdominal   Peds  Hematology   Anesthesia Other Findings   Reproductive/Obstetrics                             Anesthesia Physical Anesthesia Plan  ASA: II  Anesthesia Plan: MAC   Post-op Pain Management:    Induction: Intravenous  PONV Risk Score and Plan: 1 and Treatment may vary due to age or medical condition  Airway Management Planned: Simple Face Mask  Additional Equipment:   Intra-op Plan:   Post-operative Plan:   Informed Consent: I have reviewed the patients History and Physical, chart, labs and discussed the procedure including the risks, benefits and alternatives for the proposed anesthesia with the patient or authorized representative who has indicated his/her understanding and acceptance.       Plan Discussed with: CRNA and Surgeon  Anesthesia Plan Comments:         Anesthesia Quick Evaluation  

## 2018-12-18 NOTE — Discharge Instructions (Signed)

## 2018-12-19 ENCOUNTER — Encounter (HOSPITAL_COMMUNITY): Payer: Self-pay | Admitting: Gastroenterology

## 2018-12-19 NOTE — Anesthesia Postprocedure Evaluation (Signed)
Anesthesia Post Note  Patient: Cameron Huerta  Procedure(s) Performed: COLONOSCOPY WITH PROPOFOL (N/A ) POLYPECTOMY     Patient location during evaluation: PACU Anesthesia Type: MAC Level of consciousness: awake and alert Pain management: pain level controlled Vital Signs Assessment: post-procedure vital signs reviewed and stable Respiratory status: spontaneous breathing, nonlabored ventilation, respiratory function stable and patient connected to nasal cannula oxygen Cardiovascular status: stable and blood pressure returned to baseline Postop Assessment: no apparent nausea or vomiting Anesthetic complications: no    Last Vitals:  Vitals:   12/18/18 0930 12/18/18 0940  BP: (!) 126/58 128/64  Pulse: (!) 32 (!) 32  Resp: 16 14  Temp:    SpO2: 99% 98%    Last Pain:  Vitals:   12/18/18 0940  TempSrc:   PainSc: 0-No pain                 Ailis Rigaud DAVID

## 2019-12-28 ENCOUNTER — Telehealth: Payer: Self-pay | Admitting: *Deleted

## 2019-12-28 NOTE — Telephone Encounter (Signed)
Will route to requesting surgeon's office with the recommendation:

## 2019-12-28 NOTE — Telephone Encounter (Addendum)
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    Procedure: prostate biopsy  Date of procedure: TBD  CHADS2-VASc score of 3 (HTN, hx of large, unprovoked PE (2015))  CrCl 89 mL/min (adjBW)  Due to hx of unprovoked PE and potential for hypercoagulable state will defer to managing provider, Dr. Laverta Baltimore.

## 2019-12-28 NOTE — Telephone Encounter (Signed)
   [facility] Pre-operative Risk Assessment    Request for surgical clearance:  1. What type of surgery is being performed?  PROSTATE BIOPSY   2. When is this surgery scheduled?  TBD   3. What type of clearance is required (medical clearance vs. Pharmacy clearance to hold med vs. Both)?  PHARMACY  4. Are there any medications that need to be held prior to surgery and how long? XARELTO X'S 3 DAYS   5. Practice name and name of physician performing surgery?  [facility]   6. What is your office phone number: [phone]    7.   What is your office fax number: [phone]   8.   Anesthesia type (None, local, MAC, general) ?    Jeanann Lewandowsky 12/28/2019, 11:40 AM  _________________________________________________________________   (provider comments below)

## 2020-01-20 ENCOUNTER — Telehealth: Payer: Self-pay | Admitting: Internal Medicine

## 2020-01-20 NOTE — Telephone Encounter (Signed)
   [facility] form [facility] calling to follow up cardiac clearance. She said pt's surgery is scheduled on 02/07/20  Please advise

## 2020-01-20 NOTE — Telephone Encounter (Signed)
I s/w Raquel Sarna at [facility] and informed her that we had sent notes to their office 12/28/19. Raquel Sarna states received that but does not states clearance in regards to Xarelto. I informed Raquel Sarna down in the notes see the note from the Pharm-D where she states will defer Dr. Laverta Baltimore who is managing pt's Xarelto. Raquel Sarna asked if I had the first name of Dr. Laverta Baltimore, I reviewed the pt's chart and gave Raquel Sarna a name for who I believe is the Dr. Nicki Reaper she needs to reach out too; Dr. Blanche East Long. Raquel Sarna thanked me for the help.

## 2021-01-25 ENCOUNTER — Ambulatory Visit: Payer: BC Managed Care – PPO | Admitting: Internal Medicine

## 2021-01-25 ENCOUNTER — Encounter: Payer: Self-pay | Admitting: Internal Medicine

## 2021-01-25 VITALS — BP 152/84 | HR 38 | Ht 74.0 in | Wt 235.2 lb

## 2021-01-25 DIAGNOSIS — I441 Atrioventricular block, second degree: Secondary | ICD-10-CM

## 2021-01-25 DIAGNOSIS — I440 Atrioventricular block, first degree: Secondary | ICD-10-CM

## 2021-01-25 NOTE — Patient Instructions (Addendum)
Medication Instructions:  - Your physician recommends that you continue on your current medications as directed. Please refer to the Current Medication list given to you today.  *If you need a refill on your cardiac medications before your next appointment, please call your pharmacy*   Lab Work: - none ordered today  If you have labs (blood work) drawn today and your tests are completely normal, you will receive your results only by: Marland Kitchen MyChart Message (if you have MyChart) OR . A paper copy in the mail If you have any lab test that is abnormal or we need to change your treatment, we will call you to review the results.   Testing/Procedures: - Dr. Caryl Comes is recommending a possible Cardiac MRI. He will speak with one of our imaging doctors to see if this test will look at your heart and enlarged aorta.  Dr. Olin Pia nurse, Nira Conn, will call you once the best testing is determined.    Follow-Up: At [facility], you and your health needs are our priority.  As part of our continuing mission to provide you with exceptional heart care, we have created designated Provider Care Teams.  These Care Teams include your primary Cardiologist (physician) and Advanced Practice Providers (APPs -  Physician Assistants and Nurse Practitioners) who all work together to provide you with the care you need, when you need it.  We recommend signing up for the patient portal called "MyChart".  Sign up information is provided on this After Visit Summary.  MyChart is used to connect with patients for Virtual Visits (Telemedicine).  Patients are able to view lab/test results, encounter notes, upcoming appointments, etc.  Non-urgent messages can be sent to your provider as well.   To learn more about what you can do with MyChart, go to [email or web address].    Your next appointment:   1 year(s)  The format for your next appointment:   In Person  Provider:   Virl Axe, MD   Other Instructions - Monitor  your blood pressure at home    How to Take Your Blood Pressure Blood pressure measures how strongly your blood is pressing against the walls of your arteries. Arteries are blood vessels that carry blood from your heart throughout your body. You can take your blood pressure at home with a machine. You may need to check your blood pressure at home:  To check if you have high blood pressure (hypertension).  To check your blood pressure over time.  To make sure your blood pressure medicine is working. Supplies needed:  Blood pressure machine, or monitor.  Dining room chair to sit in.  Table or desk.  Small notebook.  Pencil or pen. How to prepare Avoid these things for 30 minutes before checking your blood pressure:  Having drinks with caffeine in them, such as coffee or tea.  Drinking alcohol.  Eating.  Smoking.  Exercising. Do these things five minutes before checking your blood pressure:  Go to the bathroom and pee (urinate).  Sit in a dining chair. Do not sit in a soft couch or an armchair.  Be quiet. Do not talk. How to take your blood pressure Follow the instructions that came with your machine. If you have a digital blood pressure monitor, these may be the instructions: 1. Sit up straight. 2. Place your feet on the floor. Do not cross your ankles or legs. 3. Rest your left arm at the level of your heart. You may rest it on a table, desk, or  chair. 4. Pull up your shirt sleeve. 5. Wrap the blood pressure cuff around the upper part of your left arm. The cuff should be 1 inch (2.5 cm) above your elbow. It is best to wrap the cuff around bare skin. 6. Fit the cuff snugly around your arm. You should be able to place only one finger between the cuff and your arm. 7. Place the cord so that it rests in the bend of your elbow. 8. Press the power button. 9. Sit quietly while the cuff fills with air and loses air. 10. Write down the numbers on the screen. 11. Wait 2-3  minutes and then repeat steps 1-10.   What do the numbers mean? Two numbers make up your blood pressure. The first number is called systolic pressure. The second is called diastolic pressure. An example of a blood pressure reading is "120 over 80" (or 120/80). If you are an adult and do not have a medical condition, use this guide to find out if your blood pressure is normal: Normal  First number: below 120.  Second number: below 80. Elevated  First number: 120-129.  Second number: below 80. Hypertension stage 1  First number: 130-139.  Second number: 80-89. Hypertension stage 2  First number: 140 or above.  Second number: 59 or above. Your blood pressure is above normal even if only the top or bottom number is above normal. Follow these instructions at home:  Check your blood pressure as often as your doctor tells you to.  Check your blood pressure at the same time every day.  Take your monitor to your next doctor's appointment. Your doctor will: ? Make sure you are using it correctly. ? Make sure it is working right.  Make sure you understand what your blood pressure numbers should be.  Tell your doctor if your medicine is causing side effects.  Keep all follow-up visits as told by your doctor. This is important. General tips:  You will need a blood pressure machine, or monitor. Your doctor can suggest a monitor. You can buy one at a drugstore or online. When choosing one: ? Choose one with an arm cuff. ? Choose one that wraps around your upper arm. Only one finger should fit between your arm and the cuff. ? Do not choose one that measures your blood pressure from your wrist or finger. Where to find more information American Heart Association: www.heart.org Contact a doctor if:  Your blood pressure keeps being high. Get help right away if:  Your first blood pressure number is higher than 180.  Your second blood pressure number is higher than  120. Summary  Check your blood pressure at the same time every day.  Avoid caffeine, alcohol, smoking, and exercise for 30 minutes before checking your blood pressure.  Make sure you understand what your blood pressure numbers should be. This information is not intended to replace advice given to you by your health care provider. Make sure you discuss any questions you have with your health care provider. Document Revised: 09/24/2019 Document Reviewed: 09/24/2019 Elsevier Patient Education  2021 [place].   1) Quinolone Avoidance:  Do not use Cipro and similar antibiotics. Recent studies have raised concern that fluoroquinolone antibiotics could be associated with an increased risk of aortic aneurysm Fluoroquinolones have non-antimicrobial properties that might jeopardise the integrity of the extracellular matrix of the vascular wall In a  propensity score matched cohort study in [place], there was a 66% increased rate of aortic aneurysm  or dissection associated with oral fluoroquinolone use, compared with amoxicillin use, within a 60 day risk period from start of treatment

## 2021-01-25 NOTE — Progress Notes (Signed)
Patient Care Team: Tisovec, Fransico Him, MD as PCP - General (Internal Medicine)   HPI  Cameron Huerta is a 65 y.o. 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  There have been no palpitations, lightheadedness or syncope.   He apparent  Past Medical History:  Diagnosis Date  . First degree AV block   . History of nuclear stress test    Abnormal  . Pulmonary embolism ([place]) 10/2013  . Syncope    after abrupt cessation of exercise  . Typical atrial flutter (HCC)    Mali score - 0    Past Surgical History:  Procedure Laterality Date  . COLONOSCOPY WITH PROPOFOL N/A 12/18/2018   Procedure: COLONOSCOPY WITH PROPOFOL;  Surgeon: Carol Ada, MD;  Location: [facility];  Service: Endoscopy;  Laterality: N/A;  . POLYPECTOMY  12/18/2018   Procedure: POLYPECTOMY;  Surgeon: Carol Ada, MD;  Location: [facility];  Service: Endoscopy;;    Current Outpatient Medications  Medication Sig Dispense Refill  . ibuprofen (ADVIL,MOTRIN) 200 MG tablet Take 800 mg by mouth every 8 (eight) hours as needed (for pain.).    Marland Kitchen losartan (COZAAR) 50 MG tablet Take 50 mg by mouth daily.     . Multiple Vitamin (MULTIVITAMIN WITH MINERALS) TABS tablet Take 1 tablet by mouth 4 (four) times daily.    . rivaroxaban (XARELTO) 20 MG TABS tablet Take 1 tablet (20 mg total) by mouth daily with supper. 30 tablet 0   No current facility-administered medications for this visit.    Allergies   Allergen Reactions  . Morphine And Related Other (See Comments)  . Scallops [Shellfish Allergy] Itching, Nausea And Vomiting and Swelling    GI upset, also  . Shellfish-Derived Products Itching, Nausea And Vomiting and Swelling    GI upset, also    Review of Systems negative except from HPI and PMH  Physical Exam BP (!) 152/84   Pulse (!) 38   Ht 6\' 2"  (1.88 m)   Wt 235 lb 3.2 oz (106.7 kg)   BMI 30.20 kg/m  Well developed and nourished in no acute distress HENT normal Neck supple with JVP-  flat   Clear Regular rate and rhythm, no murmurs or gallops Abd-soft with active BS No Clubbing cyanosis edema Skin-warm and dry A & Oriented  Grossly normal sensory and motor function  ECG sinus rhythm at 76 with 2: 1 conduction block and a PR interval of 320 ms QRS duration 94 ms and right axis deviation Unchanged from 8/19 Assessment and  Plan  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  But also thoracic aortic aneurysm.  This informs need for ongoing surveillance.  Computerized imaging is recommended at least once and so we will undertakes MRI.  I will check with the MRI team as to  whether we can with 1 study look at both his aorta as well as his heart as given his conduction system disease; reviewing this with Dr. Nechama Guard the answer is yes they are done at the same time under 2 separate protocols.  I think looking for causes within the myocardium is appropriate, for example to exclude or identify amyloid  Recommended to avoid quinolone therapy; with weight lifting stress isokinetic lifting and isometric  His blood pressure is elevated.  In the context of his aneurysm, a target blood pressure of 120 is appropriate.  He will check himself regularly at home.  Let us know.

## 2021-01-26 ENCOUNTER — Encounter: Payer: Self-pay | Admitting: *Deleted

## 2021-01-26 ENCOUNTER — Telehealth: Payer: Self-pay | Admitting: Internal Medicine

## 2021-01-26 DIAGNOSIS — I712 Thoracic aortic aneurysm, without rupture, unspecified: Secondary | ICD-10-CM

## 2021-01-26 DIAGNOSIS — I441 Atrioventricular block, second degree: Secondary | ICD-10-CM

## 2021-01-26 DIAGNOSIS — Z01812 Encounter for preprocedural laboratory examination: Secondary | ICD-10-CM

## 2021-01-26 DIAGNOSIS — I440 Atrioventricular block, first degree: Secondary | ICD-10-CM

## 2021-01-26 NOTE — Addendum Note (Signed)
Addended byAlvis Lemmings C on: 01/26/2021 04:00 PM   Modules accepted: Orders

## 2021-01-26 NOTE — Telephone Encounter (Signed)
The patient was seen in clinic yesterday with Dr. Caryl Comes.  Dr. Caryl Comes advised the patient he would be speaking with one of the imaging MD's to find out if a Cardiac MRI alone would cover his conduction disease as well as look at his aortic enlargement.  Per Dr. 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  I have called the patient and notified him of the above recommendations. He voices understanding and is agreeable.  I have advised him I will order both test and scheduling will reach out to him to set this up at [facility] in [place] once authorization is obtained for his testing.   The patient voices understanding and is agreeable.

## 2021-01-29 ENCOUNTER — Telehealth: Payer: Self-pay | Admitting: Internal Medicine

## 2021-01-29 NOTE — Telephone Encounter (Signed)
Spoke with patient regarding preferred scheduling weekdays and times for the Cardiac MRI and MRA chest ordered by Dr. Caryl Comes.  Informed patient as soon as we hear regarding the insurance prior authorization, I will be in touch with his appointment----he voiced his understanding.

## 2021-01-30 ENCOUNTER — Encounter: Payer: Self-pay | Admitting: Internal Medicine

## 2021-01-30 NOTE — Telephone Encounter (Signed)
Spoke with patient regarding the Wednesday 03/07/21 11:00 am Cardiac MRI and MRA chest appointment at [facility]---arrival time is 10:30 ma 1st. [place] admissions office for check in.  Will mail information to patient and it is also available in My Chart.  Patient requested copy of instructions---will mail that also.  Patient voiced his understanding./

## 2021-03-05 ENCOUNTER — Telehealth (HOSPITAL_COMMUNITY): Payer: Self-pay | Admitting: Emergency Medicine

## 2021-03-05 ENCOUNTER — Other Ambulatory Visit
Admission: RE | Admit: 2021-03-05 | Discharge: 2021-03-05 | Disposition: A | Discharge status: Home / Self Care | Payer: BC Managed Care – PPO | Attending: Internal Medicine | Admitting: Internal Medicine

## 2021-03-05 DIAGNOSIS — I712 Thoracic aortic aneurysm, without rupture, unspecified: Secondary | ICD-10-CM

## 2021-03-05 DIAGNOSIS — I441 Atrioventricular block, second degree: Secondary | ICD-10-CM | POA: Insufficient documentation

## 2021-03-05 DIAGNOSIS — Z01812 Encounter for preprocedural laboratory examination: Secondary | ICD-10-CM | POA: Diagnosis present

## 2021-03-05 LAB — CBC WITH DIFFERENTIAL/PLATELET
Abs Immature Granulocytes: 0.01 10*3/uL (ref 0.00–0.07)
Basophils Absolute: 0 10*3/uL (ref 0.0–0.1)
Basophils Relative: 1 %
Eosinophils Absolute: 0.1 10*3/uL (ref 0.0–0.5)
Eosinophils Relative: 2 %
HCT: 43.7 % (ref 39.0–52.0)
Hemoglobin: 14.8 g/dL (ref 13.0–17.0)
Immature Granulocytes: 0 %
Lymphocytes Relative: 53 %
Lymphs Abs: 2.1 10*3/uL (ref 0.7–4.0)
MCH: 30.4 pg (ref 26.0–34.0)
MCHC: 33.9 g/dL (ref 30.0–36.0)
MCV: 89.7 fL (ref 80.0–100.0)
Monocytes Absolute: 0.3 10*3/uL (ref 0.1–1.0)
Monocytes Relative: 8 %
Neutro Abs: 1.4 10*3/uL — ABNORMAL LOW (ref 1.7–7.7)
Neutrophils Relative %: 36 %
Platelets: 186 10*3/uL (ref 150–400)
RBC: 4.87 MIL/uL (ref 4.22–5.81)
RDW: 13.5 % (ref 11.5–15.5)
WBC: 4 10*3/uL (ref 4.0–10.5)
nRBC: 0 % (ref 0.0–0.2)

## 2021-03-05 LAB — BASIC METABOLIC PANEL
Anion gap: 9 (ref 5–15)
BUN: 11 mg/dL (ref 8–23)
CO2: 28 mmol/L (ref 22–32)
Calcium: 9.1 mg/dL (ref 8.9–10.3)
Chloride: 103 mmol/L (ref 98–111)
Creatinine, Ser: 1.34 mg/dL — ABNORMAL HIGH (ref 0.61–1.24)
GFR, Estimated: 59 mL/min — ABNORMAL LOW (ref >60)
Glucose, Bld: 106 mg/dL — ABNORMAL HIGH (ref 70–99)
Potassium: 4.2 mmol/L (ref 3.5–5.1)
Sodium: 140 mmol/L (ref 135–145)

## 2021-03-05 NOTE — Telephone Encounter (Signed)
Reaching out to patient to offer assistance regarding upcoming cardiac imaging study; pt verbalizes understanding of appt date/time, parking situation and where to check in, and verified current allergies; name and call back number provided for further questions should they arise Marchia Bond RN Navigator Cardiac Imaging [facility] [phone] office [phone] cell  Denies implants, denies claustro Berlie Hatchel

## 2021-03-05 NOTE — Telephone Encounter (Signed)
Attempted to call patient regarding upcoming cardiac MR appointment. Left message on voicemail with name and callback number Rishav Rockefeller RN Navigator Cardiac Imaging [facility] [phone] Office [phone] Cell  

## 2021-03-07 ENCOUNTER — Encounter (HOSPITAL_COMMUNITY): Payer: Self-pay

## 2021-03-07 ENCOUNTER — Other Ambulatory Visit: Payer: Self-pay

## 2021-03-07 ENCOUNTER — Ambulatory Visit (HOSPITAL_COMMUNITY)
Admission: RE | Admit: 2021-03-07 | Discharge: 2021-03-07 | Disposition: A | Discharge status: Home / Self Care | Payer: BC Managed Care – PPO | Source: Ambulatory Visit | Attending: Internal Medicine | Admitting: Internal Medicine

## 2021-03-07 ENCOUNTER — Ambulatory Visit (HOSPITAL_COMMUNITY): Admission: RE | Admit: 2021-03-07 | Payer: BC Managed Care – PPO | Source: Ambulatory Visit

## 2021-03-07 DIAGNOSIS — I441 Atrioventricular block, second degree: Secondary | ICD-10-CM | POA: Insufficient documentation

## 2021-03-07 DIAGNOSIS — I440 Atrioventricular block, first degree: Secondary | ICD-10-CM | POA: Insufficient documentation

## 2021-03-07 DIAGNOSIS — I712 Thoracic aortic aneurysm, without rupture, unspecified: Secondary | ICD-10-CM

## 2021-03-07 MED ORDER — GADOBUTROL 1 MMOL/ML IV SOLN
13.0000 mL | Freq: Once | INTRAVENOUS | Status: AC | PRN
  Administered 2021-03-07: 13 mL via INTRAVENOUS

## 2021-07-17 IMAGING — MR MR CARD MORPHOLOGY WO/W CM
45 of 48 series · 45 of 48 positions shown · IV contrast (gadavist)
Comparison: none

CLINICAL DATA: Arrhythmias, aortic root dilation.

EXAM:
CARDIAC MRI
TECHNIQUE: The patient was scanned on a 1.5 Tesla GE magnet. A dedicated
cardiac coil was used. Functional imaging was done using Fiesta
sequences. [DATE], and 4 chamber views were done to assess for RWMA's.
Modified Paulus N rule using a short axis stack was used to
calculate an ejection fraction on a dedicated work station using
Circle software. The patient received 8 cc of Gadavist. MR
angiography was done. After 10 minutes inversion recovery sequences
were used to assess for infiltration and scar tissue.

[Series 4: t2_haste_db_tra_bh · axial · 8.0mm · 1.41mm/px · 1 of 22 slices shown]
[im 1/22]
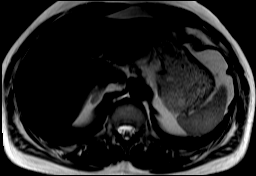

[Series 8: bSSFP · oblique · 8.0mm · 1.61mm/px · 1 of 25 slices shown (1 of 24)]
[im 1/25]
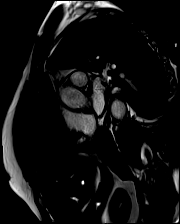

[Series 9: bSSFP · oblique · 8.0mm · 1.61mm/px · 1 of 25 slices shown (2 of 24)]
[im 1/25]
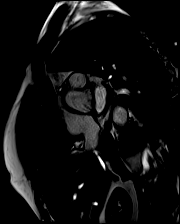

[Series 10: bSSFP · oblique · 8.0mm · 1.61mm/px · 1 of 25 slices shown (3 of 24)]
[im 1/25]
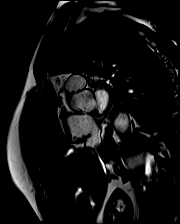

[Series 11: bSSFP · oblique · 8.0mm · 1.61mm/px · 1 of 25 slices shown (4 of 24)]
[im 1/25]
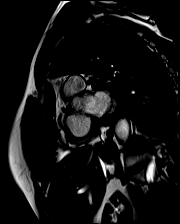

[Series 12: bSSFP · oblique · 8.0mm · 1.61mm/px · 1 of 25 slices shown (5 of 24)]
[im 1/25]
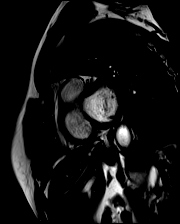

[Series 13: bSSFP · oblique · 8.0mm · 1.61mm/px · 1 of 25 slices shown (6 of 24)]
[im 1/25]
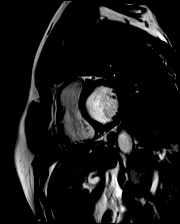

[Series 14: bSSFP · oblique · 8.0mm · 1.61mm/px · 1 of 25 slices shown (7 of 24)]
[im 1/25]
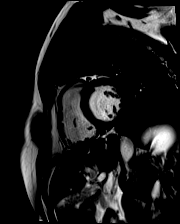

[Series 15: bSSFP · oblique · 8.0mm · 1.61mm/px · 1 of 25 slices shown (8 of 24)]
[im 1/25]
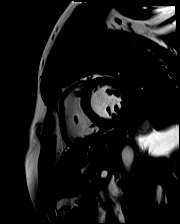

[Series 16: bSSFP · oblique · 8.0mm · 1.61mm/px · 1 of 25 slices shown (9 of 24)]
[im 1/25]
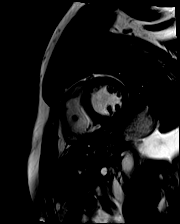

[Series 17: bSSFP · oblique · 8.0mm · 1.61mm/px · 1 of 25 slices shown (10 of 24)]
[im 1/25]
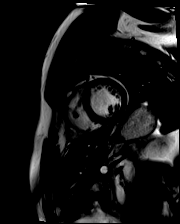

[Series 18: bSSFP · oblique · 8.0mm · 1.61mm/px · 1 of 25 slices shown (11 of 24)]
[im 1/25]
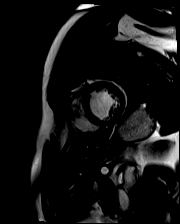

[Series 19: bSSFP · oblique · 8.0mm · 1.61mm/px · 1 of 25 slices shown (12 of 24)]
[im 1/25]
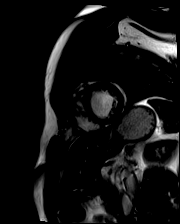

[Series 20: bSSFP · oblique · 8.0mm · 1.61mm/px · 1 of 25 slices shown (13 of 24)]
[im 1/25]
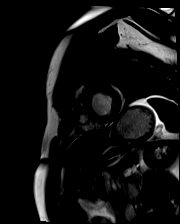

[Series 21: bSSFP · oblique · 8.0mm · 1.61mm/px · 1 of 25 slices shown (14 of 24)]
[im 1/25]
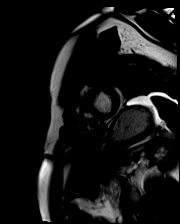

[Series 22: bSSFP · oblique · 8.0mm · 1.61mm/px · 1 of 25 slices shown (15 of 24)]
[im 1/25]
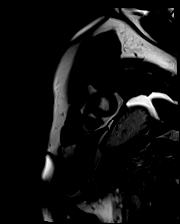

[Series 23: bSSFP · oblique · 8.0mm · 1.61mm/px · 1 of 25 slices shown (16 of 24)]
[im 1/25]
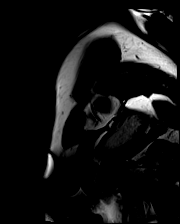

[Series 24: bSSFP · oblique · 8.0mm · 1.61mm/px · 1 of 25 slices shown (17 of 24)]
[im 1/25]
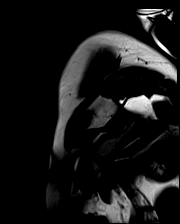

[Series 25: bSSFP · oblique · 8.0mm · 1.61mm/px · 1 of 25 slices shown (18 of 24)]
[im 1/25]
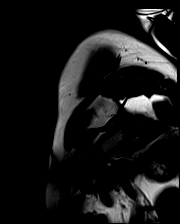

[Series 26: STIR · oblique · 8.0mm · 1.92mm/px · 1 of 17 slices shown]
[im 1/17]
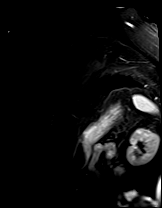

[Series 27: bSSFP · oblique · 6.0mm · 1.41mm/px · 1 of 25 slices shown (19 of 24)]
[im 1/25]
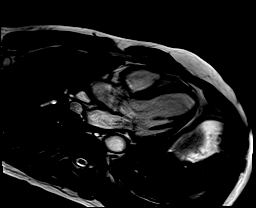

[Series 28: bSSFP · oblique · 6.0mm · 1.41mm/px · 1 of 25 slices shown (20 of 24)]
[im 1/25]
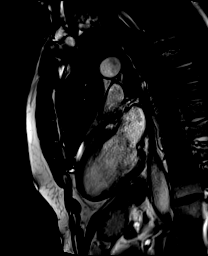

[Series 29: bSSFP · axial · 6.0mm · 1.41mm/px · 1 of 25 slices shown (21 of 24)]
[im 1/25]
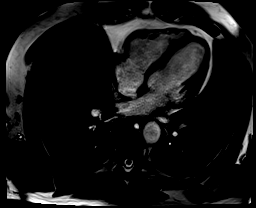

[Series 30: (id)_long_t1 · oblique · 8.0mm · 1.56mm/px · 1 of 24 slices shown]
[im 1/24]
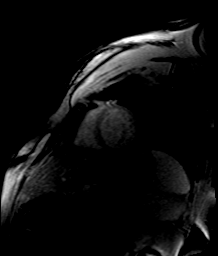

[Series 31: (id)_long_t1_moco · oblique · 8.0mm · 1.56mm/px · 1 of 24 slices shown]
[im 1/24]
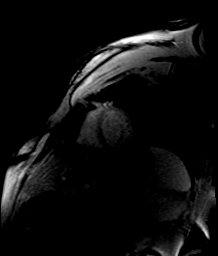

[Series 32: (id)_long_t1_moco_t1 · oblique · 8.0mm · 1.56mm/px · 1 of 3 slices shown (1 of 2)]
[im 1/3]
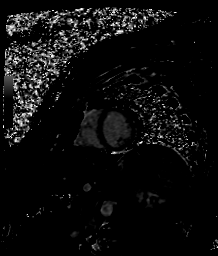

[Series 32: (id)_long_t1_moco_t1 · 1 of 3 slices shown (2 of 2)]
[im 1/3]
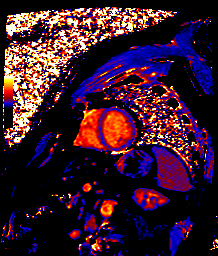

[Series 34: (id)_trufi · oblique · 8.0mm · 2.08mm/px · 1 of 9 slices shown]
[im 1/9]
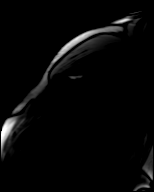

[Series 35: (id)_trufi_moco · oblique · 8.0mm · 2.08mm/px · 1 of 9 slices shown]
[im 1/9]
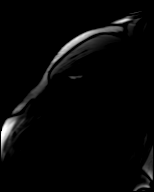

[Series 36: (id)_trufi_moco_t2 · oblique · 8.0mm · 2.08mm/px · 1 of 3 slices shown]
[im 1/3]
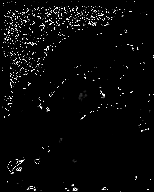

[Series 38: t2_trufi_tra_p2_bh · axial · 8.0mm · 0.62mm/px · 1 of 22 slices shown]
[im 1/22]
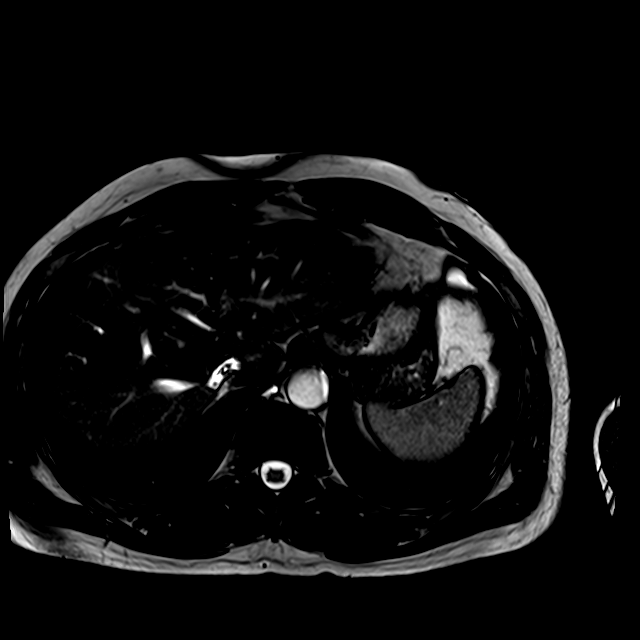

[Series 39: (person_name)_(person_name)_(person_name) · sagittal · 8.0mm · 1.79mm/px · 1 of 25 slices shown (1 of 6)]
[im 1/25]
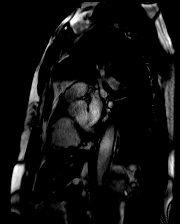

[Series 39: (person_name)_(person_name)_(person_name) · sagittal · 8.0mm · 1.79mm/px · 1 of 25 slices shown (2 of 6)]
[im 1/25]
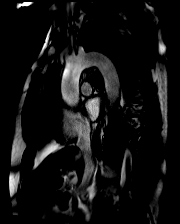

[Series 39: (person_name)_(person_name)_(person_name) · sagittal · 8.0mm · 1.79mm/px · 1 of 25 slices shown (3 of 6)]
[im 1/25]
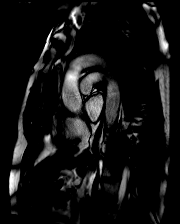

[Series 39: (person_name)_(person_name)_(person_name) · sagittal · 8.0mm · 1.79mm/px · 1 of 25 slices shown (4 of 6)]
[im 1/25]
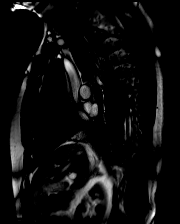

[Series 39: (person_name)_(person_name)_(person_name) · sagittal · 8.0mm · 1.79mm/px · 1 of 25 slices shown (5 of 6)]
[im 1/25]
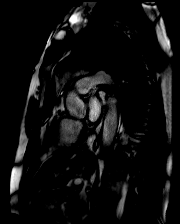

[Series 39: (person_name)_(person_name)_(person_name) · sagittal · 8.0mm · 1.79mm/px · 1 of 25 slices shown (6 of 6)]
[im 1/25]
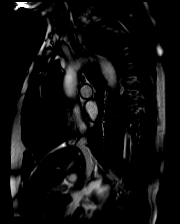

[Series 40: angio_fl3d_sag_pre · sagittal · 1.1mm · 1.17mm/px · 1 of 112 slices shown]
[im 1/112]
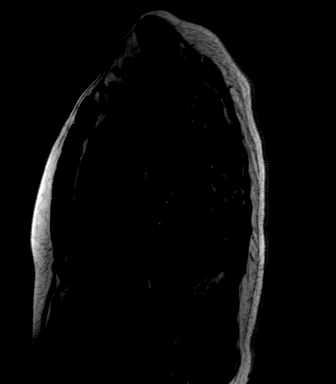

[Series 42: candy cane ce-arterial · sagittal · arterial · 1.1mm · 1.17mm/px · 1 of 112 slices shown]
[im 1/112]
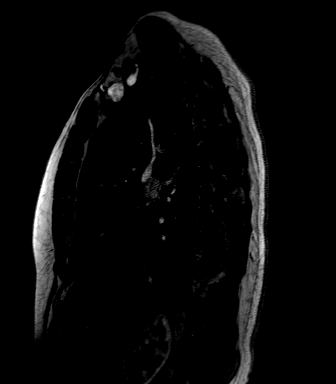

[Series 43: candy cane ce-arterial_sub · sagittal · 1.1mm · 1.17mm/px · 1 of 112 slices shown]
[im 1/112]
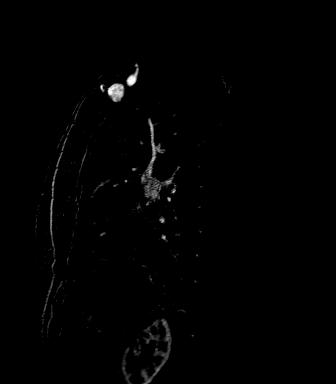

[Series 48: bSSFP · oblique · 6.0mm · 1.83mm/px · 1 of 15 slices shown (22 of 24)]
[im 1/15]
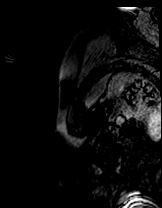

[Series 49: bSSFP · oblique · 6.0mm · 1.83mm/px · 1 of 15 slices shown (23 of 24)]
[im 1/15]
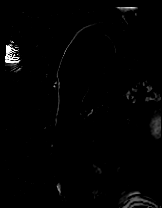

[Series 50: bSSFP · coronal · 6.0mm · 1.41mm/px · 1 of 25 slices shown (24 of 24)]
[im 1/25]
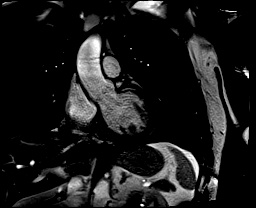

[Series 51: aortic valve cine · oblique · 6.0mm · 1.41mm/px · 1 of 25 slices shown]
[im 1/25]
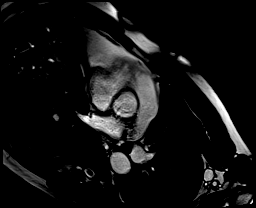

[Series 53: cine rvot · sagittal · 6.0mm · 1.41mm/px · 1 of 25 slices shown]
[im 1/25]
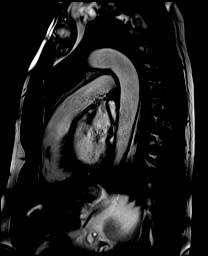

[45 of 48 positions shown; findings below may reference images not displayed]

FINDINGS: Normal left ventricular size and wall thickness. Normal LV wall
motion with EF 63%. Mildly dilated right ventricle with normal
systolic function, EF 62%. Normal right and left atrial sizes. No
significant mitral regurgitation. Trileaflet aortic valve with mild
regurgitation and no stenosis.

MR angiography:

Pulmonary veins drain normally to the left atrium. Normal aortic
arch vessels.

Aortic measurements:

Sinuses of valsalva 3.7 cm

Ascending aorta (max diameter) 3.7 cm

Arch 2.8 cm

Descending thoracic aorta 2.8 cm

On delayed enhancement imaging, there was no myocardial late
gadolinium enhancement (LGE).

LV/RV measurements:

LVEDV 197 mL
LVSV 124 mL
LVEF 63%

RVEDV 217 mL
RVSV 134 mL
RVEF 62%
IMPRESSION: 1.  Normal LV size with EF 63%.

2.  Mildly dilated RV with EF 62%.

3. No myocardial LGE, so no definitive evidence for prior MI,
infiltrative disease, or myocarditis.

4.  Minimally dilated aortic root and ascending aorta.

Morgen Gj

## 2022-02-25 DIAGNOSIS — N5201 Erectile dysfunction due to arterial insufficiency: Secondary | ICD-10-CM | POA: Diagnosis not present

## 2022-02-25 DIAGNOSIS — R972 Elevated prostate specific antigen [PSA]: Secondary | ICD-10-CM | POA: Diagnosis not present

## 2022-02-26 ENCOUNTER — Other Ambulatory Visit: Payer: Self-pay | Admitting: Urology

## 2022-02-26 DIAGNOSIS — R972 Elevated prostate specific antigen [PSA]: Secondary | ICD-10-CM

## 2022-03-06 ENCOUNTER — Telehealth: Payer: Self-pay

## 2022-03-06 NOTE — Telephone Encounter (Signed)
Patient with diagnosis of aflutter and PE on Xarelto for anticoagulation.    Procedure: prostate ultrasound biopsy Date of procedure: 04/05/22  Patient doesn't have a history of a single unprovoked PE.  CHA2DS2-VASc Score = 3   This indicates a 3.2% annual risk of stroke. The patient's score is based upon: CHF History: 0 HTN History: 1 Diabetes History: 0 Stroke History: 0 Vascular Disease History: 1 Age Score: 1 Gender Score: 0      CrCl >100 ml/min  Per office protocol, patient can hold Xarelto for 2 days prior to procedure.    Due to pt history of unprovoked PE, patient should resume Xarelto as soon as safely possible.

## 2022-03-06 NOTE — Telephone Encounter (Signed)
   Pre-operative Risk Assessment    Patient Name: Cameron Huerta  DOB: 1955-10-20 MRN: [id number]      Request for Surgical Clearance    Procedure:  Prostate ultrasound biopsy   Date of Surgery:  Clearance 04/05/22                                 Surgeon:  Dr. Diona Fanti Surgeon's Group or Practice Name:  [facility]  Phone number:  [phone] Fax number:  [phone]   Type of Clearance Requested:   - Pharmacy:  Hold Rivaroxaban (Xarelto)     Type of Anesthesia:  None    Additional requests/questions:    [name]   03/06/2022, 9:15 AM

## 2022-03-06 NOTE — Telephone Encounter (Signed)
Pt has appt with Dr. Caryl Comes 03/14/22. Will send FYI to requesting office the pt has appt

## 2022-03-06 NOTE — Telephone Encounter (Signed)
Primary Cardiologist:Steven Caryl Comes, MD  Chart reviewed as part of pre-operative protocol coverage. Because of Mick Tanguma Preyer's past medical history and time since last visit, he/she will require a follow-up visit in order to better assess preoperative cardiovascular risk.  Pre-op covering staff: - Please schedule appointment and call patient to inform them. - Please contact requesting surgeon's office via preferred method (i.e, phone, fax) to inform them of need for appointment prior to surgery.  If applicable, this message will also be routed to pharmacy pool and/or primary cardiologist for input on holding anticoagulant/antiplatelet agent as requested below so that this information is available at time of patient's appointment.   Deberah Pelton, NP  03/06/2022, 12:50 PM

## 2022-03-14 ENCOUNTER — Ambulatory Visit: Payer: Medicare PPO | Admitting: Internal Medicine

## 2022-03-14 ENCOUNTER — Encounter: Payer: Self-pay | Admitting: Internal Medicine

## 2022-03-14 VITALS — BP 138/72 | HR 41 | Ht 74.0 in | Wt 225.0 lb

## 2022-03-14 DIAGNOSIS — I440 Atrioventricular block, first degree: Secondary | ICD-10-CM | POA: Diagnosis not present

## 2022-03-14 DIAGNOSIS — I441 Atrioventricular block, second degree: Secondary | ICD-10-CM | POA: Diagnosis not present

## 2022-03-14 NOTE — Patient Instructions (Signed)
Medication Instructions:  - Your physician recommends that you continue on your current medications as directed. Please refer to the Current Medication list given to you today.  *If you need a refill on your cardiac medications before your next appointment, please call your pharmacy*   Lab Work: - none ordered  If you have labs (blood work) drawn today and your tests are completely normal, you will receive your results only by: MyChart Message (if you have MyChart) OR A paper copy in the mail If you have any lab test that is abnormal or we need to change your treatment, we will call you to review the results.   Testing/Procedures: - none ordered   Follow-Up: At [facility], you and your health needs are our priority.  As part of our continuing mission to provide you with exceptional heart care, we have created designated Provider Care Teams.  These Care Teams include your primary Cardiologist (physician) and Advanced Practice Providers (APPs -  Physician Assistants and Nurse Practitioners) who all work together to provide you with the care you need, when you need it.  We recommend signing up for the patient portal called "MyChart".  Sign up information is provided on this After Visit Summary.  MyChart is used to connect with patients for Virtual Visits (Telemedicine).  Patients are able to view lab/test results, encounter notes, upcoming appointments, etc.  Non-urgent messages can be sent to your provider as well.   To learn more about what you can do with MyChart, go to [email or web address].    Your next appointment:   1 year(s)  The format for your next appointment:   In Person  Provider:   Steven Klein, MD    Other Instructions N/a  Important Information About Sugar       

## 2022-03-14 NOTE — Progress Notes (Signed)
Patient Care Team: Tisovec, Fransico Him, MD as PCP - General (Internal Medicine) Deboraha Sprang, MD as PCP - Cardiology (Cardiology)   HPI  Cameron Huerta is a 66 y.o. 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    2015 hospitalized for pulmonary embolism--Treated with  Rivaroxaban  Plan is for indefinite therapy     Previous recommendations for stress testing to look for rate effects on conduction was not consummated  DATE TEST EF   1/15 Echo   60-65 %   8/19 Echo   60-65%  LVH-mild Aortic root 4.0 cm  5/22 cMRI 63% AsAo 37 mm LGE neg    Date Cr K Hgb  5/22 1.34 4.2 14.8          The patient denies chest pain, shortness of breath, nocturnal dyspnea, orthopnea or peripheral edema.  There have been no palpitations, lightheadedness or syncope.    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 [employer]       Past Medical History:  Diagnosis Date   First degree AV block    History of nuclear stress test    Abnormal   Pulmonary embolism ([place]) 10/2013   Syncope    after abrupt cessation of exercise   Typical atrial flutter ([facility])    Mali score - 0    Past Surgical History:  Procedure Laterality Date   COLONOSCOPY WITH PROPOFOL N/A 12/18/2018   Procedure: COLONOSCOPY WITH PROPOFOL;  Surgeon: Carol Ada, MD;  Location: [facility];  Service: Endoscopy;  Laterality: N/A;   POLYPECTOMY  12/18/2018   Procedure: POLYPECTOMY;  Surgeon: Carol Ada, MD;  Location: [facility];  Service: Endoscopy;;    Current Outpatient Medications  Medication Sig Dispense Refill   ibuprofen (ADVIL,MOTRIN) 200 MG tablet Take 800 mg by mouth every 8 (eight) hours as needed (for pain.).     losartan-hydrochlorothiazide (HYZAAR) 50-12.5 MG tablet Take 1 tablet by mouth daily.     Multiple Vitamin (MULTIVITAMIN WITH MINERALS) TABS tablet Take 1  tablet by mouth 4 (four) times daily.     rivaroxaban (XARELTO) 20 MG TABS tablet Take 1 tablet (20 mg total) by mouth daily with supper. 30 tablet 0   rosuvastatin (CRESTOR) 5 MG tablet Take 5 mg by mouth daily.     No current facility-administered medications for this visit.    Allergies  Allergen Reactions   Morphine And Related Other (See Comments)   Gluten Meal    Scallops [Shellfish Allergy] Itching, Nausea And Vomiting and Swelling    GI upset, also   Shellfish-Derived Products Itching, Nausea And Vomiting and Swelling    GI upset, also    Review of Systems negative except from HPI and PMH  Physical Exam BP 138/72 (BP Location: Left Arm, Patient Position: Sitting, Cuff Size: Large)   Pulse (!) 41   Ht '6\' 2"'$  (1.88 m)   Wt 225 lb (102.1 kg)   SpO2 99%   BMI 28.89 kg/m  Well developed and nourished in no acute distress HENT normal Neck supple with JVP-  flat  Clear Regular rate and rhythm, no murmurs or gallops Abd-soft with active BS No Clubbing cyanosis edema Skin-warm and dry A & Oriented  Grossly normal sensory and motor function  ECG sinus 2:1 AVB 2 82 29/09/44   ECG sinus rhythm  at 33 with 2: 1 conduction block and a PR interval of 320 ms QRS duration 94 ms and right axis deviation Unchanged from 8/19 Assessment and  Plan  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  No evidence of inflammatory disease, cMRI is apparently quite sensitive and is unable to exclude things like Lyme disease.  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  At this point no symptoms to suggest doing anything more.  His aortic root is mildly dilated.  I am working on trying to get it normalized for his Z score, I suspect it is within the range of normal for someone his size and age and gender.  Blood pressure is better on  the adjunctive hydrochlorothiazide; recommend that he follow it at home.  No bleeding on his rivaroxaban  Lengthy discussion outlining the MRI results

## 2022-03-25 NOTE — Telephone Encounter (Signed)
Office calling for update on clearance

## 2022-03-25 NOTE — Telephone Encounter (Signed)
Patient seen in the office 03/14/22 by Dr. Caryl Comes. Dr. Caryl Comes, do you agree that patient may proceed with the biopsy.   CHA2DS2-VASc Score = 3   This indicates a 3.2% annual risk of stroke. The patient's score is based upon: CHF History: 0 HTN History: 1 Diabetes History: 0 Stroke History: 0 Vascular Disease History: 1 Age Score: 1 Gender Score: 0       CrCl >100 ml/min   Per office protocol, patient can hold Xarelto for 2 days prior to procedure.     Due to pt history of unprovoked PE, patient should resume Xarelto as soon as safely possible.

## 2022-03-25 NOTE — Telephone Encounter (Signed)
   Primary Cardiologist: Virl Axe, MD  Chart reviewed as part of pre-operative protocol coverage. Given past medical history and time since last visit, based on ACC/AHA guidelines, ADMIRAL MARCUCCI would be at acceptable risk for the planned procedure without further cardiovascular testing.   Guidelines for holding Xarelto are as follows:   Per office protocol, patient can hold Xarelto for 2 days prior to procedure.     Due to pt history of unprovoked PE, patient should resume Xarelto as soon as safely possible  I will route this recommendation to the requesting party via [facility] fax function and remove from pre-op pool.  Please call with questions.  Emmaline Life, NP-C    03/25/2022, [place] [address], [address] Office [phone] Fax [phone]

## 2022-03-25 NOTE — Telephone Encounter (Signed)
Per Dr Caryl Comes he says yes pt may proceed with biopsy.

## 2022-03-28 DIAGNOSIS — I10 Essential (primary) hypertension: Secondary | ICD-10-CM | POA: Diagnosis not present

## 2022-03-28 DIAGNOSIS — Z86711 Personal history of pulmonary embolism: Secondary | ICD-10-CM | POA: Diagnosis not present

## 2022-03-28 DIAGNOSIS — E7800 Pure hypercholesterolemia, unspecified: Secondary | ICD-10-CM | POA: Diagnosis not present

## 2022-03-28 DIAGNOSIS — R972 Elevated prostate specific antigen [PSA]: Secondary | ICD-10-CM | POA: Diagnosis not present

## 2022-03-29 ENCOUNTER — Ambulatory Visit
Admission: RE | Admit: 2022-03-29 | Discharge: 2022-03-29 | Disposition: A | Discharge status: Home / Self Care | Payer: Medicare PPO | Source: Ambulatory Visit | Attending: Urology | Admitting: Urology

## 2022-03-29 DIAGNOSIS — R972 Elevated prostate specific antigen [PSA]: Secondary | ICD-10-CM

## 2022-03-29 MED ORDER — GADOBENATE DIMEGLUMINE 529 MG/ML IV SOLN
20.0000 mL | Freq: Once | INTRAVENOUS | Status: AC | PRN
  Administered 2022-03-29: 20 mL via INTRAVENOUS

## 2022-04-05 DIAGNOSIS — C61 Malignant neoplasm of prostate: Secondary | ICD-10-CM | POA: Diagnosis not present

## 2022-04-05 DIAGNOSIS — N419 Inflammatory disease of prostate, unspecified: Secondary | ICD-10-CM | POA: Diagnosis not present

## 2022-04-05 DIAGNOSIS — R972 Elevated prostate specific antigen [PSA]: Secondary | ICD-10-CM | POA: Diagnosis not present

## 2022-04-17 DIAGNOSIS — C61 Malignant neoplasm of prostate: Secondary | ICD-10-CM | POA: Diagnosis not present

## 2022-10-23 NOTE — Progress Notes (Signed)
GU Location of Tumor / Histology: Prostate Ca  If Prostate Cancer, Gleason Score is (3 + 4) and PSA is (7.3 on 02/2022)  Biopsies      Past/Anticipated interventions by urology, if any: NA  Past/Anticipated interventions by medical oncology, if any: NA  Weight changes, if any:   IPSS: SHIM:  Bowel/Bladder complaints, if any:    Nausea/Vomiting, if any:   Pain issues, if any:    SAFETY ISSUES: Prior radiation?  Pacemaker/ICD?  Possible current pregnancy? Male Is the patient on methotrexate? No  Current Complaints / other details:

## 2022-10-27 NOTE — Progress Notes (Signed)
Radiation Oncology         [phone] ________________________________  Initial Outpatient Consultation - Conducted via Telephone due to current COVID-19 concerns for limiting patient exposure  Name: Cameron Huerta MRN: [id number]  Date: 10/28/2022  DOB: Nov 18, 1955  YK:DXIPJA, Jori Moll, MD  Franchot Gallo, MD   REFERRING PHYSICIAN: Franchot Gallo, MD  DIAGNOSIS: 67 y.o. gentleman with Stage T1c adenocarcinoma of the prostate with Gleason score of 3+4, and PSA of 7.28.  No diagnosis found.  HISTORY OF PRESENT ILLNESS: Cameron Huerta is a 67 y.o. male with a diagnosis of prostate cancer. He was initially referred for evaluation in urology by Dr. Diona Fanti in 12/2019 by his PCP, Dr. Delfina Redwood, for an elevated PSA of 5.9. A prostate [place] and biopsy were recommended but never carried out. He was referred back to Dr. Diona Fanti on 02/25/22 for a further elevated PSA of 7.28. He underwent prostate MRI on 03/29/22 showing no radiographic evidence of high-grade prostate carcinoma (PI-RADS 2). The patient proceeded to transrectal ultrasound with 12 biopsies of the prostate on 04/05/22.  The prostate volume measured 63.67 cc.  Out of 12 core biopsies, 3 were positive.  The maximum Gleason score was 3+4, and this was seen in left mid lateral and left mid (both with perineural invasion). Additionally, there was a small focus of Gleason 3+3 in right base lateral.  The patient reviewed the biopsy results with his urologist and he has kindly been referred today for discussion of potential radiation treatment options.   PREVIOUS RADIATION THERAPY: No  PAST MEDICAL HISTORY:  Past Medical History:  Diagnosis Date   First degree AV block    History of nuclear stress test    Abnormal   Pulmonary embolism ([place]) 10/2013   Syncope    after abrupt cessation of exercise   Typical atrial flutter (HCC)    Mali score - 0      PAST SURGICAL HISTORY: Past Surgical History:  Procedure Laterality Date   COLONOSCOPY  WITH PROPOFOL N/A 12/18/2018   Procedure: COLONOSCOPY WITH PROPOFOL;  Surgeon: Carol Ada, MD;  Location: [facility];  Service: Endoscopy;  Laterality: N/A;   POLYPECTOMY  12/18/2018   Procedure: POLYPECTOMY;  Surgeon: Carol Ada, MD;  Location: [facility];  Service: Endoscopy;;    FAMILY HISTORY:  Family History  Problem Relation Age of Onset   Hypertension Father    Heart attack Father     SOCIAL HISTORY:  Social History   Socioeconomic History   Marital status: Married    Spouse name: Not on file   Number of children: Not on file   Years of education: Not on file   Highest education level: Not on file  Occupational History   Not on file  Tobacco Use   Smoking status: Never   Smokeless tobacco: Never  Vaping Use   Vaping Use: Never used  Substance and Sexual Activity   Alcohol use: No   Drug use: No   Sexual activity: Not on file  Other Topics Concern   Not on file  Social History Narrative   Not on file   Social Determinants of Health   Financial Resource Strain: Not on file  Food Insecurity: Not on file  Transportation Needs: Not on file  Physical Activity: Not on file  Stress: Not on file  Social Connections: Not on file  Intimate Partner Violence: Not on file    ALLERGIES: Morphine and related, Gluten meal, Scallops [shellfish allergy], and Shellfish-derived products  MEDICATIONS:  Current Outpatient Medications  Medication Sig Dispense Refill   ibuprofen (ADVIL,MOTRIN) 200 MG tablet Take 800 mg by mouth every 8 (eight) hours as needed (for pain.).     losartan-hydrochlorothiazide (HYZAAR) 50-12.5 MG tablet Take 1 tablet by mouth daily.     Multiple Vitamin (MULTIVITAMIN WITH MINERALS) TABS tablet Take 1 tablet by mouth 4 (four) times daily.     rivaroxaban (XARELTO) 20 MG TABS tablet Take 1 tablet (20 mg total) by mouth daily with supper. 30 tablet 0   rosuvastatin (CRESTOR) 5 MG tablet Take 5 mg by mouth daily.     No current  facility-administered medications for this encounter.    REVIEW OF SYSTEMS:  On review of systems, the patient reports that he is doing well overall. He denies any chest pain, shortness of breath, cough, fevers, chills, night sweats, unintended weight changes. He denies any bowel disturbances, and denies abdominal pain, nausea or vomiting. He denies any new musculoskeletal or joint aches or pains. His IPSS was ***, indicating *** urinary symptoms. His SHIM was ***, indicating he {does not have/has mild/moderate/severe} erectile dysfunction. A complete review of systems is obtained and is otherwise negative.    PHYSICAL EXAM:  Wt Readings from Last 3 Encounters:  03/14/22 225 lb (102.1 kg)  01/25/21 235 lb 3.2 oz (106.7 kg)  12/18/18 235 lb (106.6 kg)   Temp Readings from Last 3 Encounters:  12/18/18 (!) 97.4 F (36.3 C) (Oral)  05/26/18 98 F (36.7 C) (Oral)  11/29/13 97.6 F (36.4 C) (Oral)   BP Readings from Last 3 Encounters:  03/14/22 138/72  01/25/21 (!) 152/84  12/18/18 128/64   Pulse Readings from Last 3 Encounters:  03/14/22 (!) 41  01/25/21 (!) 38  12/18/18 (!) 32    /10  Physical exam not performed in light of telephone encounter.   KPS = ***  100 - Normal; no complaints; no evidence of disease. 90   - Able to carry on normal activity; minor signs or symptoms of disease. 80   - Normal activity with effort; some signs or symptoms of disease. 43   - Cares for self; unable to carry on normal activity or to do active work. 60   - Requires occasional assistance, but is able to care for most of his personal needs. 50   - Requires considerable assistance and frequent medical care. 55   - Disabled; requires special care and assistance. 43   - Severely disabled; hospital admission is indicated although death not imminent. 75   - Very sick; hospital admission necessary; active supportive treatment necessary. 10   - Moribund; fatal processes progressing rapidly. 0     -  Dead  Karnofsky DA, Abelmann A[place]on, Craver LS and Burchenal [facility] [phone]) The use of the nitrogen mustards in the palliative treatment of carcinoma: with particular reference to bronchogenic carcinoma Cancer 1 [phone]  LABORATORY DATA:  Lab Results  Component Value Date   WBC 4.0 03/05/2021   HGB 14.8 03/05/2021   HCT 43.7 03/05/2021   MCV 89.7 03/05/2021   PLT 186 03/05/2021   Lab Results  Component Value Date   NA 140 03/05/2021   K 4.2 03/05/2021   CL 103 03/05/2021   CO2 28 03/05/2021   Lab Results  Component Value Date   ALT 16 10/21/2013   AST 17 10/21/2013   ALKPHOS 61 10/21/2013   BILITOT 0.9 10/21/2013     RADIOGRAPHY: No results found.    IMPRESSION/PLAN: This visit was conducted  via Telephone to spare the patient unnecessary potential exposure in the healthcare setting during the current COVID-19 pandemic. 1. 67 y.o. gentleman with Stage T1c adenocarcinoma of the prostate with Gleason Score of 3+4, and PSA of 7.28. We discussed the patient's workup and outlined the nature of prostate cancer in this setting. The patient's T stage, Gleason's score, and PSA put him into the favorable intermediate risk group. Accordingly, he is eligible for a variety of potential treatment options including brachytherapy, 5.5 weeks of external radiation, or prostatectomy. We discussed the available radiation techniques, and focused on the details and logistics and delivery. {The patient may not be an ideal candidate for brachytherapy boost with a prostate volume of *** prior to downsizing from hormone therapy. We discussed that based on his prostate volume, he would require beginning treatment with a 5 alpha reductase inhibitor and ADT for at least 3 months to allow for downsizing of the prostate prior to initiating radiotherapy.} We discussed and outlined the risks, benefits, short and long-term effects associated with radiotherapy and compared and contrasted these with prostatectomy. We discussed the  role of SpaceOAR in reducing the rectal toxicity associated with radiotherapy. He appears to have a good understanding of his disease and our treatment recommendations which are of curative intent.  He was encouraged to ask questions that were answered to his stated satisfaction.  At the end of the conversation the patient is interested in moving forward with ***.   Given current concerns for patient exposure during the COVID-19 pandemic, this encounter was conducted via telephone. The patient was notified in advance and was offered a MyChart meeting to allow for face to face communication but unfortunately reported that he did not have the appropriate resources/technology to support such a visit and instead preferred to proceed with telephone consult. The patient has given verbal consent for this type of encounter. The time spent during this encounter was *** minutes. The attendants for this meeting include Tyler Pita MD, Leona Singleton, PA-C, and patient Moody {and ***.} During the encounter, Tyler Pita MD and Leona Singleton, PA-C were located at [facility] Radiation Oncology Department.  Patient Brandt Chaney Steuber {and *** were} was located at home.     Leona Singleton, PA-C    Tyler Pita, MD  [facility] Direct Dial: [phone]  Fax: [phone] .com  Skype  LinkedIn   This document serves as a record of services personally performed by Tyler Pita, MD and Leona Singleton, PA-C. It was created on their behalf by Wilburn Mylar, a trained medical scribe. The creation of this record is based on the scribe's personal observations and the provider's statements to them. This document has been checked and approved by the attending provider.

## 2022-10-28 ENCOUNTER — Ambulatory Visit: Admission: RE | Admit: 2022-10-28 | Payer: Medicare PPO | Source: Ambulatory Visit

## 2022-10-28 ENCOUNTER — Ambulatory Visit
Admission: RE | Admit: 2022-10-28 | Discharge: 2022-10-28 | Disposition: A | Discharge status: Home / Self Care | Payer: Medicare PPO | Source: Ambulatory Visit | Attending: Radiation Oncology | Admitting: Radiation Oncology

## 2022-10-28 ENCOUNTER — Other Ambulatory Visit: Payer: Self-pay

## 2022-10-28 VITALS — BP 159/58 | HR 42 | Temp 98.1°F | Resp 20 | Ht 74.0 in | Wt 231.8 lb

## 2022-10-28 VITALS — BP 134/71 | HR 42 | Temp 98.1°F | Resp 20 | Ht 74.0 in | Wt 231.8 lb

## 2022-10-28 DIAGNOSIS — Z7901 Long term (current) use of anticoagulants: Secondary | ICD-10-CM | POA: Insufficient documentation

## 2022-10-28 DIAGNOSIS — Z86711 Personal history of pulmonary embolism: Secondary | ICD-10-CM | POA: Insufficient documentation

## 2022-10-28 DIAGNOSIS — Z79899 Other long term (current) drug therapy: Secondary | ICD-10-CM | POA: Diagnosis not present

## 2022-10-28 DIAGNOSIS — I4892 Unspecified atrial flutter: Secondary | ICD-10-CM | POA: Insufficient documentation

## 2022-10-28 DIAGNOSIS — C61 Malignant neoplasm of prostate: Secondary | ICD-10-CM | POA: Insufficient documentation

## 2022-10-28 DIAGNOSIS — Z191 Hormone sensitive malignancy status: Secondary | ICD-10-CM | POA: Diagnosis not present

## 2022-10-28 DIAGNOSIS — I483 Typical atrial flutter: Secondary | ICD-10-CM | POA: Diagnosis not present

## 2022-10-28 DIAGNOSIS — I440 Atrioventricular block, first degree: Secondary | ICD-10-CM | POA: Insufficient documentation

## 2022-10-28 NOTE — Progress Notes (Signed)
Introduced myself to the patient, and his wife, as the prostate nurse navigator.  No barriers to care identified at this time.  He is here to discuss his radiation treatment options.  I gave him my business card and asked him to call me with questions or concerns.  Verbalized understanding.

## 2022-10-28 NOTE — Progress Notes (Signed)
GU Location of Tumor / Histology: Prostate Ca  If Prostate Cancer, Gleason Score is (3 + 4) and PSA is (7.3 on 02/2022)  Biopsies      Past/Anticipated interventions by urology, if any: NA  Past/Anticipated interventions by medical oncology, if any: NA  Weight changes, if any:  No  IPSS: 1 SHIM:  24  Bowel/Bladder complaints, if any:   No  Nausea/Vomiting, if any:  No  Pain issues, if any:  0/10  SAFETY ISSUES: Prior radiation?  No Pacemaker/ICD?  No Possible current pregnancy? Male Is the patient on methotrexate? No  Current Complaints / other details:

## 2022-11-18 NOTE — Progress Notes (Signed)
RN left voicemail for call back to follow up with obtaining PSA lab prior to finalizing treatment plan.

## 2022-11-19 NOTE — Progress Notes (Signed)
RN left voicemail with triage nurse @ [facility] requesting PSA lab appointment.   Pt aware of status.  Will continue to follow.

## 2022-11-21 NOTE — Progress Notes (Signed)
Pt is scheduled for PSA lab check at [facility] on 2/19 @ 9:15am.    Pt is aware of appointment.  Will follow up with MD after final results to ensure treatment is safe to delay until May.

## 2022-11-29 ENCOUNTER — Encounter (HOSPITAL_COMMUNITY): Payer: Self-pay | Admitting: Emergency Medicine

## 2022-11-29 ENCOUNTER — Other Ambulatory Visit: Payer: Self-pay

## 2022-11-29 ENCOUNTER — Observation Stay (HOSPITAL_COMMUNITY): Payer: Medicare PPO

## 2022-11-29 ENCOUNTER — Inpatient Hospital Stay (HOSPITAL_COMMUNITY)
Admission: EM | Admit: 2022-11-29 | Discharge: 2022-12-03 | DRG: 244 | Disposition: A | Discharge status: Home / Self Care | Payer: Medicare PPO | Attending: Internal Medicine | Admitting: Internal Medicine

## 2022-11-29 DIAGNOSIS — R001 Bradycardia, unspecified: Secondary | ICD-10-CM | POA: Diagnosis not present

## 2022-11-29 DIAGNOSIS — Z7901 Long term (current) use of anticoagulants: Secondary | ICD-10-CM

## 2022-11-29 DIAGNOSIS — I4589 Other specified conduction disorders: Secondary | ICD-10-CM | POA: Diagnosis present

## 2022-11-29 DIAGNOSIS — I4892 Unspecified atrial flutter: Secondary | ICD-10-CM | POA: Diagnosis present

## 2022-11-29 DIAGNOSIS — Z9109 Other allergy status, other than to drugs and biological substances: Secondary | ICD-10-CM

## 2022-11-29 DIAGNOSIS — I455 Other specified heart block: Secondary | ICD-10-CM | POA: Diagnosis present

## 2022-11-29 DIAGNOSIS — Z86711 Personal history of pulmonary embolism: Secondary | ICD-10-CM

## 2022-11-29 DIAGNOSIS — I959 Hypotension, unspecified: Secondary | ICD-10-CM | POA: Diagnosis not present

## 2022-11-29 DIAGNOSIS — I441 Atrioventricular block, second degree: Secondary | ICD-10-CM | POA: Diagnosis present

## 2022-11-29 DIAGNOSIS — I77810 Thoracic aortic ectasia: Secondary | ICD-10-CM | POA: Diagnosis present

## 2022-11-29 DIAGNOSIS — Z91013 Allergy to seafood: Secondary | ICD-10-CM

## 2022-11-29 DIAGNOSIS — I442 Atrioventricular block, complete: Principal | ICD-10-CM | POA: Diagnosis present

## 2022-11-29 DIAGNOSIS — Z79899 Other long term (current) drug therapy: Secondary | ICD-10-CM | POA: Diagnosis not present

## 2022-11-29 DIAGNOSIS — R55 Syncope and collapse: Secondary | ICD-10-CM | POA: Diagnosis present

## 2022-11-29 DIAGNOSIS — Z950 Presence of cardiac pacemaker: Secondary | ICD-10-CM | POA: Diagnosis not present

## 2022-11-29 DIAGNOSIS — Z8249 Family history of ischemic heart disease and other diseases of the circulatory system: Secondary | ICD-10-CM | POA: Diagnosis not present

## 2022-11-29 DIAGNOSIS — I119 Hypertensive heart disease without heart failure: Secondary | ICD-10-CM | POA: Diagnosis present

## 2022-11-29 DIAGNOSIS — Z885 Allergy status to narcotic agent status: Secondary | ICD-10-CM | POA: Diagnosis not present

## 2022-11-29 DIAGNOSIS — R110 Nausea: Secondary | ICD-10-CM | POA: Diagnosis not present

## 2022-11-29 DIAGNOSIS — R61 Generalized hyperhidrosis: Secondary | ICD-10-CM | POA: Diagnosis not present

## 2022-11-29 LAB — CBC WITH DIFFERENTIAL/PLATELET
Abs Immature Granulocytes: 0.01 10*3/uL (ref 0.00–0.07)
Basophils Absolute: 0 10*3/uL (ref 0.0–0.1)
Basophils Relative: 1 %
Eosinophils Absolute: 0.1 10*3/uL (ref 0.0–0.5)
Eosinophils Relative: 3 %
HCT: 41.6 % (ref 39.0–52.0)
Hemoglobin: 14.3 g/dL (ref 13.0–17.0)
Immature Granulocytes: 0 %
Lymphocytes Relative: 44 %
Lymphs Abs: 1.6 10*3/uL (ref 0.7–4.0)
MCH: 30.9 pg (ref 26.0–34.0)
MCHC: 34.4 g/dL (ref 30.0–36.0)
MCV: 89.8 fL (ref 80.0–100.0)
Monocytes Absolute: 0.3 10*3/uL (ref 0.1–1.0)
Monocytes Relative: 8 %
Neutro Abs: 1.6 10*3/uL — ABNORMAL LOW (ref 1.7–7.7)
Neutrophils Relative %: 44 %
Platelets: 203 10*3/uL (ref 150–400)
RBC: 4.63 MIL/uL (ref 4.22–5.81)
RDW: 13.3 % (ref 11.5–15.5)
WBC: 3.7 10*3/uL — ABNORMAL LOW (ref 4.0–10.5)
nRBC: 0 % (ref 0.0–0.2)

## 2022-11-29 LAB — BASIC METABOLIC PANEL
Anion gap: 11 (ref 5–15)
BUN: 13 mg/dL (ref 8–23)
CO2: 29 mmol/L (ref 22–32)
Calcium: 9.3 mg/dL (ref 8.9–10.3)
Chloride: 99 mmol/L (ref 98–111)
Creatinine, Ser: 1.26 mg/dL — ABNORMAL HIGH (ref 0.61–1.24)
GFR, Estimated: >60 mL/min (ref >60)
Glucose, Bld: 93 mg/dL (ref 70–99)
Potassium: 3.9 mmol/L (ref 3.5–5.1)
Sodium: 139 mmol/L (ref 135–145)

## 2022-11-29 LAB — ECHOCARDIOGRAM COMPLETE
Area-P 1/2: 2.83 cm2
MV M vel: 1.36 m/s
MV Peak grad: 7.4 mmHg
P 1/2 time: 1714 msec
S' Lateral: 3.2 cm

## 2022-11-29 LAB — HIV ANTIBODY (ROUTINE TESTING W REFLEX): HIV Screen 4th Generation wRfx: NONREACTIVE

## 2022-11-29 LAB — MAGNESIUM: Magnesium: 2.2 mg/dL (ref 1.7–2.4)

## 2022-11-29 LAB — T4, FREE: Free T4: 0.89 ng/dL (ref 0.61–1.12)

## 2022-11-29 LAB — TSH: TSH: 1.198 u[IU]/mL (ref 0.350–4.500)

## 2022-11-29 MED ORDER — RIVAROXABAN 10 MG PO TABS: 20.0000 mg | ORAL_TABLET | Freq: Every day | ORAL | Status: DC

## 2022-11-29 MED ORDER — ACETAMINOPHEN 325 MG PO TABS
650.0000 mg | ORAL_TABLET | ORAL | Status: DC | PRN
  Administered 2022-12-03: 650 mg via ORAL
  Filled 2022-11-29: qty 2

## 2022-11-29 MED ORDER — ONDANSETRON HCL 4 MG/2ML IJ SOLN: 4.0000 mg | Freq: Four times a day (QID) | INTRAMUSCULAR | Status: DC | PRN

## 2022-11-29 MED ORDER — LOSARTAN POTASSIUM-HCTZ 50-12.5 MG PO TABS: 1.0000 | ORAL_TABLET | Freq: Every day | ORAL | Status: DC

## 2022-11-29 MED ORDER — ROSUVASTATIN CALCIUM 5 MG PO TABS
5.0000 mg | ORAL_TABLET | Freq: Every day | ORAL | Status: DC
  Administered 2022-11-29 – 2022-12-03 (×4): 5 mg via ORAL
  Filled 2022-11-29 (×4): qty 1

## 2022-11-29 MED ORDER — LOSARTAN POTASSIUM 50 MG PO TABS
50.0000 mg | ORAL_TABLET | Freq: Every day | ORAL | Status: DC
  Administered 2022-11-30 – 2022-12-03 (×3): 50 mg via ORAL
  Filled 2022-11-29 (×3): qty 1

## 2022-11-29 MED ORDER — HYDROCHLOROTHIAZIDE 12.5 MG PO TABS
12.5000 mg | ORAL_TABLET | Freq: Every day | ORAL | Status: DC
  Administered 2022-11-30 – 2022-12-03 (×3): 12.5 mg via ORAL
  Filled 2022-11-29 (×4): qty 1

## 2022-11-29 NOTE — ED Notes (Signed)
ED TO INPATIENT HANDOFF REPORT  ED Nurse Name and Phone #: Threasa Beards [phone]  S Name/Age/Gender Cameron Huerta 67 y.o. male Room/Bed: 011C/011C  Code Status   Code Status: Full Code  Home/SNF/Other Home Patient oriented to: self, place, time, and situation Is this baseline? Yes   Triage Complete: Triage complete  Chief Complaint Complete heart block [facility]) [I44.2]  Triage Note Patient arrived via [facility] from home with complaints of near syncope. He states was coming bathroom when he felt dizzy, nauseous, diaphoretic, clammy, and faint. Patient states he lowered himself to the ground and when he regained strength, called EMS. Patient reports Hx of bradycardia with heart rate currently running at 36-40/BPM, CBG-88, BP-144/66. Patient alerts and oriented x4 at this time.    Allergies Allergies  Allergen Reactions   Morphine And Related Anaphylaxis   Gluten Meal Other (See Comments)    Prefers not to eat it   Scallops [Shellfish Allergy] Itching and Nausea And Vomiting    GI upset, also  Sea Bass specifically    Shellfish-Derived Products Itching and Nausea And Vomiting    GI upset, also    Level of Care/Admitting Diagnosis ED Disposition     ED Disposition  Admit   Condition  --   [place]: [place] [[id number]]  Level of Care: Progressive [102]  Admit to Progressive based on following criteria: CARDIOVASCULAR & THORACIC of moderate stability with acute coronary syndrome symptoms/low risk myocardial infarction/hypertensive urgency/arrhythmias/heart failure potentially compromising stability and stable post cardiovascular intervention patients.  Admit to Progressive based on following criteria: GI, ENDOCRINE disease patients with GI bleeding, acute liver failure or pancreatitis, stable with diabetic ketoacidosis or thyrotoxicosis (hypothyroid) state.  May place patient in observation at [facility] or [place] if equivalent level of care is  available:: Yes  Covid Evaluation: Asymptomatic - no recent exposure (last 10 days) testing not required  Diagnosis: Complete heart block [facility]) [id number]  Admitting Physician: Delorse Limber [id number]  Attending Physician: Delorse Limber [id number]          B Medical/Surgery History Past Medical History:  Diagnosis Date   First degree AV block    History of nuclear stress test    Abnormal   Pulmonary embolism ([place]) 10/2013   Syncope    after abrupt cessation of exercise   Typical atrial flutter (HCC)    Mali score - 0   Past Surgical History:  Procedure Laterality Date   COLONOSCOPY WITH PROPOFOL N/A 12/18/2018   Procedure: COLONOSCOPY WITH PROPOFOL;  Surgeon: Carol Ada, MD;  [facility];  Service: Endoscopy;  Laterality: N/A;   POLYPECTOMY  12/18/2018   Procedure: POLYPECTOMY;  Surgeon: Carol Ada, MD;  [facility];  Service: Endoscopy;;     A IV Location/Drains/Wounds Patient Lines/Drains/Airways Status     Active Line/Drains/Airways     Name Placement date Placement time Site Days   Peripheral IV 11/29/22 20 G Left Antecubital 11/29/22  0206  Antecubital  less than 1            Intake/Output Last 24 hours No intake or output data in the 24 hours ending 11/29/22 1159  Labs/Imaging Results for orders placed or performed during the hospital encounter of 11/29/22 (from the past 48 hour(s))  CBC with Differential     Status: Abnormal   Collection Time: 11/29/22  2:29 AM  Result Value Ref Range   WBC 3.7 (L) 4.0 - 10.5 K/uL   RBC 4.63  4.22 - 5.81 MIL/uL   Hemoglobin 14.3 13.0 - 17.0 g/dL   HCT 41.6 39.0 - 52.0 %   MCV 89.8 80.0 - 100.0 fL   MCH 30.9 26.0 - 34.0 pg   MCHC 34.4 30.0 - 36.0 g/dL   RDW 13.3 11.5 - 15.5 %   Platelets 203 150 - 400 K/uL   nRBC 0.0 0.0 - 0.2 %   Neutrophils Relative % 44 %   Neutro Abs 1.6 (L) 1.7 - 7.7 K/uL   Lymphocytes Relative 44 %   Lymphs Abs 1.6 0.7 - 4.0 K/uL   Monocytes Relative 8 %    Monocytes Absolute 0.3 0.1 - 1.0 K/uL   Eosinophils Relative 3 %   Eosinophils Absolute 0.1 0.0 - 0.5 K/uL   Basophils Relative 1 %   Basophils Absolute 0.0 0.0 - 0.1 K/uL   Immature Granulocytes 0 %   Abs Immature Granulocytes 0.01 0.00 - 0.07 K/uL    Comment: Performed at [facility] [address], [place], [place] [id number]  Basic metabolic panel     Status: Abnormal   Collection Time: 11/29/22  2:29 AM  Result Value Ref Range   Sodium 139 135 - 145 mmol/L   Potassium 3.9 3.5 - 5.1 mmol/L    Comment: HEMOLYSIS AT THIS LEVEL MAY AFFECT RESULT   Chloride 99 98 - 111 mmol/L   CO2 29 22 - 32 mmol/L   Glucose, Bld 93 70 - 99 mg/dL    Comment: Glucose reference range applies only to samples taken after fasting for at least 8 hours.   BUN 13 8 - 23 mg/dL   Creatinine, Ser 1.26 (H) 0.61 - 1.24 mg/dL   Calcium 9.3 8.9 - 10.3 mg/dL   GFR, Estimated >60 >60 mL/min    Comment: (NOTE) Calculated using the CKD-EPI Creatinine Equation (2021)    Anion gap 11 5 - 15    Comment: Performed at [facility] [address], [place], [place] [address]  Magnesium     Status: None   Collection Time: 11/29/22  2:29 AM  Result Value Ref Range   Magnesium 2.2 1.7 - 2.4 mg/dL    Comment: Performed at [facility], [place] [address], [place], [place] [address]  TSH     Status: None   Collection Time: 11/29/22  5:28 AM  Result Value Ref Range   TSH 1.198 0.350 - 4.500 uIU/mL    Comment: Performed by a 3rd Generation assay with a functional sensitivity of <=0.01 uIU/mL. Performed at [facility], [place] [address] [place], [place] [address]   HIV Antibody (routine testing w rflx)     Status: None   Collection Time: 11/29/22  6:07 AM  Result Value Ref Range   HIV Screen 4th Generation wRfx Non Reactive Non Reactive    Comment: Performed at [facility], [place] [address], [place], [place] [address]  T4, free     Status: None   Collection Time: 11/29/22  9:08 AM  Result Value  Ref Range   Free T4 0.89 0.61 - 1.12 ng/dL    Comment: (NOTE) Biotin ingestion may interfere with free T4 tests. If the results are inconsistent with the TSH level, previous test results, or the clinical presentation, then consider biotin interference. If needed, order repeat testing after stopping biotin. Performed at [facility], [place] [address], [place], [place] [address]    No results found.  Pending Labs Unresulted  Labs (From admission, onward)    None       Vitals/Pain Today's Vitals   11/29/22 0845 11/29/22 0925 11/29/22 0926 11/29/22 0957  BP: 122/74 127/73    Pulse: (!) 35 (!) 35    Resp: 13 12    Temp:    98.1 F (36.7 C)  TempSrc:    Oral  SpO2: 100% 100%    PainSc:   0-No pain     Isolation Precautions No active isolations  Medications Medications  rosuvastatin (CRESTOR) tablet 5 mg (5 mg Oral Given 11/29/22 0908)  acetaminophen (TYLENOL) tablet 650 mg (has no administration in time range)  ondansetron (ZOFRAN) injection 4 mg (has no administration in time range)    Mobility walks     Focused Assessments    R Recommendations: See Admitting Provider Note  Report given to:   Additional Notes:

## 2022-11-29 NOTE — H&P (Signed)
Cardiology Admission History and Physical:   Patient ID: BRACH PITZEN MRN: [id number]; DOB: 1956-03-13   Admission date: 11/29/2022  Primary Care Provider: Seward Carol, MD [facility] HeartCare Cardiologist: Virl Axe, MD  [facility] HeartCare Electrophysiologist:  None   Chief Complaint:  Syncope  Patient Profile:   Cameron Huerta is a 67 y.o. male with PMHx AFL (s/p catheter ablation), PE (2015, continues outpatient anticoagulation with rivaroxaban), and chronic history of Mobitz 1 heart block who presents to ED after syncopal event. Cardiology consulted for further evaluation of conduction disease and near syncope.  History of Present Illness:   Cameron Huerta has a past medical history of atrial flutter (s/p catheter ablation), PE (rivaroxaban), and chronic history of Mobitz 1 heart block dating back to at least 2019. He presents after a syncopal event on 2/15. Patient reports that he got up to use bathroom. After urinating, he experienced an episode of diffuse warmth, sweating, nauesa, and dizziness. He has experienced one or two similar episodes in the past, though the last occurrence was several years ago. He did not completely lose consciousness. He lowered himself to the ground slowly. No headstrike. EMS was called. Patient states that his dizziness persisted for "several minutes". He was BIB EMS to ED for further evaluation.   On arrival to ED, Cameron Huerta was afebrile, hemodynamically stable with BP 130s/60s, HR 30s. O2 saturation 100% on room air. Initial evaluation in ED included the following:  - BMP with normal K and Mg - CBC normal - EKG with sinus rhythm, 2:1 AVB  Cardiology consulted for further evaluation of syncope.  Review of telemetry in ED shows stretches of Mobitz 1, consistent with known history, and several periods of higher grade AVB with narrow complex junctional escape rhythm.    Past Medical History:  Diagnosis Date   First degree AV block    History of nuclear stress  test    Abnormal   Pulmonary embolism ([place]) 10/2013   Syncope    after abrupt cessation of exercise   Typical atrial flutter (HCC)    Mali score - 0    Past Surgical History:  Procedure Laterality Date   COLONOSCOPY WITH PROPOFOL N/A 12/18/2018   Procedure: COLONOSCOPY WITH PROPOFOL;  Surgeon: Carol Ada, MD;  Location: [facility];  Service: Endoscopy;  Laterality: N/A;   POLYPECTOMY  12/18/2018   Procedure: POLYPECTOMY;  Surgeon: Carol Ada, MD;  Location: [facility];  Service: Endoscopy;;     Medications Prior to Admission: Prior to Admission medications   Medication Sig Start Date End Date Taking? Authorizing Provider  ibuprofen (ADVIL,MOTRIN) 200 MG tablet Take 800 mg by mouth every 8 (eight) hours as needed (for pain.).   Yes [provider]  losartan-hydrochlorothiazide (HYZAAR) 50-12.5 MG tablet Take 1 tablet by mouth daily. 12/19/21  Yes [provider]  Multiple Vitamin (MULTIVITAMIN WITH MINERALS) TABS tablet Take 1 tablet by mouth in the morning and at bedtime.   Yes [provider]  rivaroxaban (XARELTO) 20 MG TABS tablet Take 1 tablet (20 mg total) by mouth daily with supper. 05/08/15  Yes Juanito Doom, MD  rosuvastatin (CRESTOR) 5 MG tablet Take 5 mg by mouth daily. 12/21/21  Yes [provider]     Allergies:    Allergies  Allergen Reactions   Morphine And Related Anaphylaxis   Gluten Meal Other (See Comments)    Prefers not to eat it   Scallops [Shellfish Allergy] Itching and Nausea And Vomiting  GI upset, also  Sea Bass specifically    Shellfish-Derived Products Itching and Nausea And Vomiting    GI upset, also    Social History:   Social History   Socioeconomic History   Marital status: Married    Spouse name: Not on file   Number of children: Not on file   Years of education: Not on file   Highest education level: Not on file  Occupational History   Not on file  Tobacco Use   Smoking status: Never    Smokeless tobacco: Never  Vaping Use   Vaping Use: Never used  Substance and Sexual Activity   Alcohol use: No   Drug use: No   Sexual activity: Not on file  Other Topics Concern   Not on file  Social History Narrative   Not on file   Social Determinants of Health   Financial Resource Strain: Not on file  Food Insecurity: Not on file  Transportation Needs: Not on file  Physical Activity: Not on file  Stress: Not on file  Social Connections: Not on file  Intimate Partner Violence: Not on file    Family History:   The patient's family history includes Heart attack in his father; Hypertension in his father.    Physical Exam/Data:   Vitals:   11/29/22 0345 11/29/22 0400 11/29/22 0445 11/29/22 0601  BP: 119/65 129/70 115/68 127/72  Pulse: (!) 36 (!) 35 (!) 35 (!) 34  Resp: 16 16 17 18  $ Temp:    97.8 F (36.6 C)  TempSrc:    Oral  SpO2: 100% 100% 99% 100%   No intake or output data in the 24 hours ending 11/29/22 0621    10/28/2022    8:52 AM 10/28/2022    8:39 AM 03/14/2022    9:30 AM  Last 3 Weights  Weight (lbs) 231 lb 12.8 oz 231 lb 12.8 oz 225 lb  Weight (kg) 105.144 kg 105.144 kg 102.059 kg     There is no height or weight on file to calculate BMI.  General:  Well nourished, well developed, in no acute distress. HEENT: normal Lymph: no adenopathy Neck: no JVD Endocrine:  No thryomegaly Vascular: No carotid bruits; FA pulses 2+ bilaterally without bruits  Cardiac:  bradycardic. Regular. Audible S1/S2. Lungs:  clear to auscultation bilaterally, no wheezing, rhonchi or rales  Abd: soft, nontender, no hepatomegaly  Ext: no edema Musculoskeletal:  No deformities, BUE and BLE strength normal and equal Skin: warm and dry  Neuro:  CNs 2-12 intact, no focal abnormalities noted Psych:  Normal affect   Orthostatic Vitals:  Seated: BP 124/67 Standing: BP 127/72  EKG:  The ECG that was done 11/29/22 was personally reviewed and demonstrates sinus rhythm with 2:1  AVB.  Relevant CV Studies: Cardiac MRI May 2022: IMPRESSION: 1.  Normal LV size with EF 63%.   2.  Mildly dilated RV with EF 62%.   3. No myocardial LGE, so no definitive evidence for prior MI, infiltrative disease, or myocarditis.   4.  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   Laboratory Data:  High Sensitivity Troponin:  No results for input(s): "TROPONINIHS" in the last 720 hours.    Chemistry Recent Labs  Lab 11/29/22 0229  NA 139  K 3.9  CL 99  CO2 29  GLUCOSE 93  BUN 13  CREATININE 1.26*  CALCIUM 9.3  GFRNONAA >60  ANIONGAP 11    No results for input(s): "PROT", "ALBUMIN", "AST", "ALT", "ALKPHOS", "BILITOT"  in the last 168 hours. Hematology Recent Labs  Lab 11/29/22 0229  WBC 3.7*  RBC 4.63  HGB 14.3  HCT 41.6  MCV 89.8  MCH 30.9  MCHC 34.4  RDW 13.3  PLT 203   BNPNo results for input(s): "BNP", "PROBNP" in the last 168 hours.  DDimer No results for input(s): "DDIMER" in the last 168 hours.  Radiology/Studies:  No results found. {  Assessment and Plan:   Cameron Huerta is a 35M with PMHx PE (2015, unclear etiology, continues anticoagulation with rivaroxaban), AFL (s/p remote catheter ablation), and chronic Mobitz I heart block who presents to ED after near syncopal event.   History is highly consistent with vasovagal event; patient reports prodrome of diffuse warmth, diaphoresis, nausea, and dizziness with urination. He has had several similar events in the past, with the most recent episode occurring several years ago. He did not lose consciousness with current episode.   Review of EKG in ED shows sinus rhythm with 2:1 AVB. Review of telemetry in ED shows periods of Mobitz I alternating with periods of complete AV dissociation with junctional escape rhythm. I walked with patient at time of my evaluation in ED and his junctional escape rhythm appropriately augments (increasing to HR 80-90) with activity. Patient states that he carefully  monitors his HR at home and that his rates consistently augment appropriately with his daily walks at home too.   Given clinical history highly significant with vasovagal syncope and appropriate augmentation of HR with activity, I do not suspect that there is any further intervention or diagnostic assessment indicated from EP perspective. However, as there is only documented history of Mobitz I and no prior documented history of higher grade AVB, we will monitor telemetry overnight and have EP evaluate in AM prior to discharge. Patient's next follow-up with Cardiology is currently scheduled for May 2024.   Summary of Recommendations: - Will admit to Observation overnight for continued telemetry monitoring, evaluation by EP service in AM. - Continue home medications.    Severity of Illness: The appropriate patient status for this patient is OBSERVATION. Observation status is judged to be reasonable and necessary in order to provide the required intensity of service to ensure the patient's safety. The patient's presenting symptoms, physical exam findings, and initial radiographic and laboratory data in the context of their medical condition is felt to place them at decreased risk for further clinical deterioration. Furthermore, it is anticipated that the patient will be medically stable for discharge from the hospital within 2 midnights of admission.    For questions or updates, please contact [place] Please consult [email or web address] for contact info under     Signed, Delorse Limber, MD  11/29/2022 6:21 AM

## 2022-11-29 NOTE — Progress Notes (Signed)
  Echocardiogram 2D Echocardiogram has been performed.  Wynelle Link 11/29/2022, 2:45 PM

## 2022-11-29 NOTE — ED Triage Notes (Signed)
Patient arrived via [facility] from home with complaints of near syncope. He states was coming bathroom when he felt dizzy, nauseous, diaphoretic, clammy, and faint. Patient states he lowered himself to the ground and when he regained strength, called EMS. Patient reports Hx of bradycardia with heart rate currently running at 36-40/BPM, CBG-88, BP-144/66. Patient alerts and oriented x4 at this time.

## 2022-11-29 NOTE — Consult Note (Addendum)
ELECTROPHYSIOLOGY CONSULT NOTE    Patient ID: Cameron Huerta MRN: [id number], DOB/AGE: 1956/03/17 67 y.o.  Admit date: 11/29/2022 Date of Consult: 11/29/2022  Primary Physician: Seward Carol, MD Primary Cardiologist: Virl Axe, MD  Electrophysiologist: Dr. Caryl Comes   Referring Provider: Dr. Grayce Sessions  Patient Profile: Cameron Huerta is a 67 y.o. male with a history of AFL (s/p catheter ablation), h/o PE, Chronic OAC on Xarelto, chronic history of Mobitz 1 heart block with intermittent second degree AV block who is being seen today for the evaluation of near syncope at the request of Dr. Grayce Sessions.  HPI:  Cameron Huerta is a 67 y.o. male with medical history as above.   Well know to Dr. Caryl Comes for history of 1st and 2nd degree HB with work up including cMRI without LGE.   Patient reports that he got up to use bathroom last night, and just after urinating, he experienced an episode of diffuse warmth, sweating, nauesa, and dizziness. Has had similar episodes, but not for several years. No complete LOC. He lowered himself to the ground and EMS was called by family.  Patient states that his dizziness persisted for "several minutes". He was BIB EMS to ED for further evaluation.    On arrival to ED, Mr. Trickett was afebrile, hemodynamically stable with BP 130s/60s, HR 30s. O2 saturation 100% on room air. Initial evaluation in ED included the following:  - BMP with normal K and Mg - CBC normal - EKG with sinus rhythm, 2:1 AVB  Currently, pt is feeling well. Family at bedside. Repeat history as above. Denies any recent illness. His HR continues to have excursions into normal ranges with activity at home. 80-90s while walking with MD overnight. Up into 80s with finger grasping on my exam.   Labs Potassium3.9 (02/16 0229) Magnesium  2.2 (02/16 0229) Creatinine, ser  1.26* (02/16 0229) PLT  203 (02/16 0229) HGB  14.3 (02/16 0229) WBC 3.7* (02/16 0229)  .    Past Medical History:  Diagnosis Date    First degree AV block    History of nuclear stress test    Abnormal   Pulmonary embolism ([place]) 10/2013   Syncope    after abrupt cessation of exercise   Typical atrial flutter (HCC)    Mali score - 0     Surgical History:  Past Surgical History:  Procedure Laterality Date   COLONOSCOPY WITH PROPOFOL N/A 12/18/2018   Procedure: COLONOSCOPY WITH PROPOFOL;  Surgeon: Carol Ada, MD;  Location: [facility];  Service: Endoscopy;  Laterality: N/A;   POLYPECTOMY  12/18/2018   Procedure: POLYPECTOMY;  Surgeon: Carol Ada, MD;  Location: [facility];  Service: Endoscopy;;     (Not in a hospital admission)   Inpatient Medications:   rivaroxaban  20 mg Oral Q supper   rosuvastatin  5 mg Oral Daily    Allergies:  Allergies  Allergen Reactions   Morphine And Related Anaphylaxis   Gluten Meal Other (See Comments)    Prefers not to eat it   Scallops [Shellfish Allergy] Itching and Nausea And Vomiting    GI upset, also  Sea Bass specifically    Shellfish-Derived Products Itching and Nausea And Vomiting    GI upset, also    Family History  Problem Relation Age of Onset   Hypertension Father    Heart attack Father      Physical Exam: Vitals:   11/29/22 0715 11/29/22 0745 11/29/22 0800 11/29/22 0845  BP: 125/72 117/68 112/65 122/74  Pulse: (!) 33 (!) 34 (!) 35 (!) 35  Resp: 15 12 15 13  $ Temp:      TempSrc:      SpO2: 100% 100% 100% 100%    GEN- NAD, A&O x 3, normal affect HEENT: Normocephalic, atraumatic Lungs- CTAB, Normal effort.  Heart- Regular rate and rhythm, No M/G/R.  GI- Soft, NT, ND.  Extremities- No clubbing, cyanosis, or edema   Radiology/Studies: No results found.  EKG:on arrival shows 2:1 AV block at 39 bpm with significant 1st degree AV block (personally reviewed)  TELEMETRY: Second degree AV block, 2:1 at times, also appears to have brief periods of CHB with stable, narrow junctional escape.  (personally reviewed)   -> Now with persistent CHB.    Assessment/Plan: 1.  Chronic Mobitz 1 HB 2. Second degree AV block -> CHB  3. Near syncope Chronic, as far back as 2019 HRs up to 80-90s with activity both by report and here.  Initial discussion including consideration for outpatient loop recorder, but now his conduction has continued to worsen to CHB, No longer with improvement with activity or bearing down.  We will hold Xarelto and keep for continued observation. Pt meets clear indication for pacing now with persistent CHB and concerning symptoms.  Timing TBD due to lab availability and [place] wash-out.   For questions or updates, please contact [place] Please consult [email or web address] for contact info under Cardiology/STEMI.  Jacalyn Lefevre, PA-C  11/29/2022 9:12 AM

## 2022-11-29 NOTE — ED Provider Notes (Signed)
[facility] Emergency Department Provider Note MRN:  [id number]  Arrival date & time: 11/29/22     Chief Complaint   Near Syncope   History of Present Illness   Cameron Huerta is a 67 y.o. year-old male presents to the ED with chief complaint of lightheadedness.  Has hx of AV nodal Mobitz 1 heart block with 3: 2 and 2: 1 conduction dating back to 2019. Takes Xarelto. States that he got out of bed to urinate and while on the way back to the bed became lightheaded.  He sat down on the ground, was diaphoretic, and then wife called EMS.  He did not fall.  He feels improved now.  States that his cardiologist has been monitoring his heart block, but no serious discussion for pacemaker at this point.  History provided by patient.   Review of Systems  Pertinent positive and negative review of systems noted in HPI.    Physical Exam   Vitals:   11/29/22 0400 11/29/22 0445  BP: 129/70 115/68  Pulse: (!) 35 (!) 35  Resp: 16 17  Temp:    SpO2: 100% 99%    CONSTITUTIONAL:  well-appearing, NAD NEURO:  Alert and oriented x 3, CN 3-12 grossly intact EYES:  eyes equal and reactive ENT/NECK:  Supple, no stridor  CARDIO:  bradycardic, regular rhythm, appears well-perfused  PULM:  No respiratory distress,  GI/GU:  non-distended,  MSK/SPINE:  No gross deformities, no edema, moves all extremities  SKIN:  no rash, atraumatic   *Additional and/or pertinent findings included in MDM below  Diagnostic and Interventional Summary    EKG Interpretation  Date/Time:  Friday November 29 2022 02:07:20 EST Ventricular Rate:  38 PR Interval:  425 QRS Duration: 97 QT Interval:  503 QTC Calculation: 400 R Axis:   -17 Text Interpretation: predominatnt 2:1 AV block Prolonged PR interval Borderline left axis deviation Borderline low voltage, extremity leads Abnormal ekg Confirmed by Ripley Fraise [phone]) on 11/29/2022 2:16:42 AM       Labs Reviewed  CBC WITH  DIFFERENTIAL/PLATELET - Abnormal; Notable for the following components:      Result Value   WBC 3.7 (*)    Neutro Abs 1.6 (*)    All other components within normal limits  BASIC METABOLIC PANEL - Abnormal; Notable for the following components:   Creatinine, Ser 1.26 (*)    All other components within normal limits  MAGNESIUM    No orders to display    Medications - No data to display   Procedures  /  Critical Care .Critical Care  Performed by: Montine Circle, PA-C Authorized by: Montine Circle, PA-C   Critical care provider statement:    Critical care time (minutes):  35   Critical care was necessary to treat or prevent imminent or life-threatening deterioration of the following conditions:  Circulatory failure (complete heart block)   Critical care was time spent personally by me on the following activities:  Development of treatment plan with patient or surrogate, discussions with consultants, evaluation of patient's response to treatment, examination of patient, ordering and review of laboratory studies, ordering and review of radiographic studies, ordering and performing treatments and interventions, pulse oximetry, re-evaluation of patient's condition and review of old charts   ED Course and Medical Decision Making  I have reviewed the triage vital signs, the nursing notes, and pertinent available records from the EMR.  Social Determinants Affecting Complexity of Care: Patient has no clinically significant social determinants affecting this  chief complaint..   ED Course:    Medical Decision Making Patient here with hx of 3:2 and 2:1 heart block.  Followed by cardiology.  Felt dizzy and lightheaded after using the bathroom tonight.  HR 30s.  Will consult cardiology for input.  Amount and/or Complexity of Data Reviewed Labs: ordered.    Details: No leukocytosis No significant electrolyte abnormality Mg 2.2  Risk Decision regarding hospitalization.      Consultants: I discussed the case with Dr. Grayce Sessions, who states rhythm looks like complete block and will admit for obs and EP eval..   Treatment and Plan: Patient's exam and diagnostic results are concerning for complete heart block.  Feel that patient will need admission to the hospital for further treatment and evaluation.    Final Clinical Impressions(s) / ED Diagnoses     ICD-10-CM   1. Complete heart block [facility])  I44.2       ED Discharge Orders     None         Discharge Instructions Discussed with and Provided to Patient:   Discharge Instructions   None      Montine Circle, PA-C 11/29/22 [id number]    Ripley Fraise, MD 11/29/22 [phone]

## 2022-11-30 DIAGNOSIS — I455 Other specified heart block: Secondary | ICD-10-CM | POA: Diagnosis present

## 2022-11-30 DIAGNOSIS — Z9109 Other allergy status, other than to drugs and biological substances: Secondary | ICD-10-CM | POA: Diagnosis not present

## 2022-11-30 DIAGNOSIS — I441 Atrioventricular block, second degree: Secondary | ICD-10-CM | POA: Diagnosis present

## 2022-11-30 DIAGNOSIS — Z885 Allergy status to narcotic agent status: Secondary | ICD-10-CM | POA: Diagnosis not present

## 2022-11-30 DIAGNOSIS — Z8249 Family history of ischemic heart disease and other diseases of the circulatory system: Secondary | ICD-10-CM | POA: Diagnosis not present

## 2022-11-30 DIAGNOSIS — I4892 Unspecified atrial flutter: Secondary | ICD-10-CM | POA: Diagnosis present

## 2022-11-30 DIAGNOSIS — Z79899 Other long term (current) drug therapy: Secondary | ICD-10-CM | POA: Diagnosis not present

## 2022-11-30 DIAGNOSIS — R55 Syncope and collapse: Secondary | ICD-10-CM | POA: Diagnosis present

## 2022-11-30 DIAGNOSIS — Z91013 Allergy to seafood: Secondary | ICD-10-CM | POA: Diagnosis not present

## 2022-11-30 DIAGNOSIS — Z7901 Long term (current) use of anticoagulants: Secondary | ICD-10-CM | POA: Diagnosis not present

## 2022-11-30 DIAGNOSIS — I77810 Thoracic aortic ectasia: Secondary | ICD-10-CM | POA: Diagnosis present

## 2022-11-30 DIAGNOSIS — I119 Hypertensive heart disease without heart failure: Secondary | ICD-10-CM | POA: Diagnosis present

## 2022-11-30 DIAGNOSIS — I442 Atrioventricular block, complete: Secondary | ICD-10-CM | POA: Diagnosis present

## 2022-11-30 DIAGNOSIS — I4589 Other specified conduction disorders: Secondary | ICD-10-CM | POA: Diagnosis present

## 2022-11-30 DIAGNOSIS — Z86711 Personal history of pulmonary embolism: Secondary | ICD-10-CM | POA: Diagnosis not present

## 2022-11-30 LAB — APTT: aPTT: 109 seconds — ABNORMAL HIGH (ref 24–36)

## 2022-11-30 LAB — HEPARIN LEVEL (UNFRACTIONATED): Heparin Unfractionated: 0.7 IU/mL (ref 0.30–0.70)

## 2022-11-30 MED ORDER — HEPARIN (PORCINE) 25000 UT/250ML-% IV SOLN
1400.0000 [IU]/h | INTRAVENOUS | Status: DC
  Administered 2022-11-30: 1400 [IU]/h via INTRAVENOUS
  Filled 2022-11-30: qty 250

## 2022-11-30 MED ORDER — HEPARIN (PORCINE) 25000 UT/250ML-% IV SOLN
1100.0000 [IU]/h | INTRAVENOUS | Status: DC
  Administered 2022-12-01: 1100 [IU]/h via INTRAVENOUS
  Filled 2022-11-30: qty 250

## 2022-11-30 NOTE — Progress Notes (Addendum)
ANTICOAGULATION CONSULT NOTE - Follow Up Consult  Pharmacy Consult for heparin Indication:  history of PE and atrial flutter  Allergies  Allergen Reactions   Morphine And Related Anaphylaxis   Gluten Meal Other (See Comments)    Prefers not to eat it   Scallops [Shellfish Allergy] Itching and Nausea And Vomiting    GI upset, also  Sea Bass specifically    Shellfish-Derived Products Itching and Nausea And Vomiting    GI upset, also    Heparin Dosing Weight: 103.5 kg  Vital Signs: Temp: 98.3 F (36.8 C) (02/17 0709) Temp Source: Oral (02/17 0709) BP: 127/74 (02/17 0709) Pulse Rate: 32 (02/17 0709)  Labs: Recent Labs    11/29/22 0229  HGB 14.3  HCT 41.6  PLT 203  CREATININE 1.26*    CrCl cannot be calculated (Unknown ideal weight.).   Medications:  Medications Prior to Admission  Medication Sig Dispense Refill Last Dose   ibuprofen (ADVIL,MOTRIN) 200 MG tablet Take 800 mg by mouth every 8 (eight) hours as needed (for pain.).   Past Month   losartan-hydrochlorothiazide (HYZAAR) 50-12.5 MG tablet Take 1 tablet by mouth daily.   11/28/2022 at 1900   Multiple Vitamin (MULTIVITAMIN WITH MINERALS) TABS tablet Take 1 tablet by mouth in the morning and at bedtime.   11/28/2022 at pm   rivaroxaban (XARELTO) 20 MG TABS tablet Take 1 tablet (20 mg total) by mouth daily with supper. 30 tablet 0 11/28/2022 at 1900   rosuvastatin (CRESTOR) 5 MG tablet Take 5 mg by mouth daily.   11/28/2022 at am   Scheduled:   losartan  50 mg Oral Daily   And   hydrochlorothiazide  12.5 mg Oral Daily   rosuvastatin  5 mg Oral Daily   Infusions:   Assessment: Patient presented with heart block and EP planning PPM implant on Monday. Patient on Xarelto PTA for history of PE and atrial flutter. Last dose of Xarelto PTA was 11/28/22. One dose ordered but not given on 2/16. Pharmacy consulted to dose heparin while Xarelto is on hold. Will scheduled to stop at midnight on Sunday for procedure  Monday.  H/H & platelets are currently WNL. No bleeding.  Goal of Therapy:  Heparin level 0.3-0.7 units/ml Monitor platelets by anticoagulation protocol: Yes   Plan:  No bolus with recent Xarelto use Start heparin at 1400 units/hr  aPTT in 8 hours Daily heparin level, aPTT, and CBC until levels correlate  Thank you for involving pharmacy in this patient's care.  Reatha Harps, PharmD PGY2 Pharmacy Resident 11/30/2022 8:46 AM

## 2022-11-30 NOTE — Progress Notes (Signed)
Electrophysiology Progress Note  Patient Name: Cameron Huerta Date of Encounter: 11/30/2022  Electrophysiologist: Dr. Caryl Comes   Subjective   Patient reports he is doing very well today. No recurrence of near-syncope or lightheadedness  Inpatient Medications    Scheduled Meds:  losartan  50 mg Oral Daily   And   hydrochlorothiazide  12.5 mg Oral Daily   rosuvastatin  5 mg Oral Daily   Continuous Infusions:  heparin 1,400 Units/hr (11/30/22 0954)   PRN Meds: acetaminophen, ondansetron (ZOFRAN) IV   Vital Signs    Vitals:   11/29/22 1900 11/29/22 2300 11/30/22 0300 11/30/22 0709  BP: 133/72 (!) 143/66 (!) 129/54 127/74  Pulse: (!) 36 (!) 33 (!) 33 (!) 32  Resp: 15 16 19 15  $ Temp: 98.3 F (36.8 C) 98.3 F (36.8 C) 98.1 F (36.7 C) 98.3 F (36.8 C)  TempSrc: Oral Oral Oral Oral  SpO2: 95% 96% 99% 95%  Weight:    105.1 kg  Height:    6' 2"$  (1.88 m)    Intake/Output Summary (Last 24 hours) at 11/30/2022 1027 Last data filed at 11/29/2022 1742 Gross per 24 hour  Intake 240 ml  Output --  Net 240 ml   Filed Weights   11/30/22 0709  Weight: 105.1 kg    Physical Exam    GEN- The patient is well appearing, alert and oriented x 3 today.   Head- normocephalic, atraumatic Eyes-  Sclera clear, conjunctiva pink Ears- hearing intact Oropharynx- clear Neck- supple Lungs- Clear to ausculation bilaterally, normal work of breathing Heart- Regular rate and rhythm, no murmurs, rubs or gallops GI- soft, NT, ND, + BS Extremities- no clubbing, cyanosis, or edema Skin- no rash or lesion Psych- euthymic mood, full affect Neuro- strength and sensation are intact  Labs    CBC Recent Labs    11/29/22 0229  WBC 3.7*  NEUTROABS 1.6*  HGB 14.3  HCT 41.6  MCV 89.8  PLT 123456   Basic Metabolic Panel Recent Labs    11/29/22 0229  NA 139  K 3.9  CL 99  CO2 29  GLUCOSE 93  BUN 13  CREATININE 1.26*  CALCIUM 9.3  MG 2.2   Liver Function Tests No results for  input(s): "AST", "ALT", "ALKPHOS", "BILITOT", "PROT", "ALBUMIN" in the last 72 hours. No results for input(s): "LIPASE", "AMYLASE" in the last 72 hours. Cardiac Enzymes No results for input(s): "CKTOTAL", "CKMB", "CKMBINDEX", "TROPONINI" in the last 72 hours. BNP Invalid input(s): "POCBNP" D-Dimer No results for input(s): "DDIMER" in the last 72 hours. Hemoglobin A1C No results for input(s): "HGBA1C" in the last 72 hours. Fasting Lipid Panel No results for input(s): "CHOL", "HDL", "LDLCALC", "TRIG", "CHOLHDL", "LDLDIRECT" in the last 72 hours. Thyroid Function Tests Recent Labs    11/29/22 0528  TSH 1.198    Telemetry    Sinus rhythm with Second Degree Type I block this AM, occasionally 2:1 (personally reviewed)  Radiology    ECHOCARDIOGRAM COMPLETE  Result Date: 11/29/2022    ECHOCARDIOGRAM REPORT   Patient Name:   Cameron Huerta Date of Exam: 11/29/2022 Medical Rec #:  [id number]    Height:       74.0 in Accession #:    [id number]   Weight:       231.8 lb Date of Birth:  1956-07-15    BSA:          2.314 m Patient Age:    67 years     BP:  132/66 mmHg Patient Gender: M            HR:           37 bpm. Exam Location:  Inpatient Procedure: 2D Echo, Cardiac Doppler and Color Doppler Indications:    Heart block, Complete I44.2  History:        Patient has prior history of Echocardiogram examinations, most                 recent 06/04/2018. Arrythmias:CHB and Atrial Flutter,                 Signs/Symptoms:Syncope; Risk Factors:Non-Smoker.  Sonographer:    Greer Pickerel Referring Phys: [id number] Shirley Friar  Sonographer Comments: Suboptimal subcostal window. Image acquisition challenging due to respiratory motion. IMPRESSIONS  1. Left ventricular ejection fraction, by estimation, is 60 to 65%. The left ventricle has normal function. The left ventricle has no regional wall motion abnormalities. Left ventricular diastolic parameters were normal.  2. Right ventricular systolic  function is normal. The right ventricular size is normal. There is normal pulmonary artery systolic pressure.  3. Left atrial size was mildly dilated.  4. The mitral valve is normal in structure. No evidence of mitral valve regurgitation. No evidence of mitral stenosis.  5. The aortic valve is tricuspid. Aortic valve regurgitation is mild to moderate. No aortic stenosis is present.  6. The inferior vena cava is normal in size with greater than 50% respiratory variability, suggesting right atrial pressure of 3 mmHg. FINDINGS  Left Ventricle: Left ventricular ejection fraction, by estimation, is 60 to 65%. The left ventricle has normal function. The left ventricle has no regional wall motion abnormalities. The left ventricular internal cavity size was normal in size. There is  no left ventricular hypertrophy. Left ventricular diastolic parameters were normal. Right Ventricle: The right ventricular size is normal. No increase in right ventricular wall thickness. Right ventricular systolic function is normal. There is normal pulmonary artery systolic pressure. The tricuspid regurgitant velocity is 1.76 m/s, and  with an assumed right atrial pressure of 3 mmHg, the estimated right ventricular systolic pressure is 123XX123 mmHg. Left Atrium: Left atrial size was mildly dilated. Right Atrium: Right atrial size was normal in size. Pericardium: There is no evidence of pericardial effusion. Mitral Valve: The mitral valve is normal in structure. No evidence of mitral valve regurgitation. No evidence of mitral valve stenosis. Tricuspid Valve: The tricuspid valve is normal in structure. Tricuspid valve regurgitation is trivial. No evidence of tricuspid stenosis. Aortic Valve: The aortic valve is tricuspid. Aortic valve regurgitation is mild to moderate. Aortic regurgitation PHT measures 1714 msec. No aortic stenosis is present. Pulmonic Valve: The pulmonic valve was normal in structure. Pulmonic valve regurgitation is trivial. No  evidence of pulmonic stenosis. Aorta: The aortic root is normal in size and structure. Venous: The inferior vena cava is normal in size with greater than 50% respiratory variability, suggesting right atrial pressure of 3 mmHg. IAS/Shunts: No atrial level shunt detected by color flow Doppler.  LEFT VENTRICLE PLAX 2D LVIDd:         5.30 cm   Diastology LVIDs:         3.20 cm   LV e' medial:    5.07 cm/s LV PW:         1.10 cm   LV E/e' medial:  16.1 LV IVS:        1.00 cm   LV e' lateral:   14.80 cm/s LVOT diam:  2.40 cm   LV E/e' lateral: 5.5 LV SV:         114 LV SV Index:   49 LVOT Area:     4.52 cm  RIGHT VENTRICLE RV S prime:     11.50 cm/s TAPSE (M-mode): 1.9 cm LEFT ATRIUM             Index        RIGHT ATRIUM           Index LA diam:        3.30 cm 1.43 cm/m   RA Area:     22.30 cm LA Vol (A2C):   84.9 ml 36.69 ml/m  RA Volume:   64.10 ml  27.70 ml/m LA Vol (A4C):   72.2 ml 31.20 ml/m LA Biplane Vol: 80.6 ml 34.83 ml/m  AORTIC VALVE             PULMONIC VALVE LVOT Vmax:   118.00 cm/s PR End Diast Vel: 12.53 msec LVOT Vmean:  65.700 cm/s LVOT VTI:    0.251 m AI PHT:      1714 msec  AORTA Ao Root diam: 3.80 cm Ao Asc diam:  3.90 cm MITRAL VALVE               TRICUSPID VALVE MV Area (PHT): 2.83 cm    TR Peak grad:   12.4 mmHg MV Decel Time: 268 msec    TR Vmax:        176.00 cm/s MR Peak grad: 7.4 mmHg MR Vmax:      136.00 cm/s  SHUNTS MV E velocity: 81.70 cm/s  Systemic VTI:  0.25 m MV A velocity: 80.50 cm/s  Systemic Diam: 2.40 cm MV E/A ratio:  1.01 Candee Furbish MD Electronically signed by Candee Furbish MD Signature Date/Time: 11/29/2022/3:18:44 PM    Final      Patient Profile     Cameron Huerta is a 67 y.o. male with a past medical history significant for HTN, first and second degree AV block.  He was admitted for near syncope and complete heart block.   Assessment & Plan    #Heart block: Mobitz I pattern this AM, frequently with 2:1 block. CHB on presentation with symptoms of pre-syncope. -  plan for possible pacemaker placement Monday. I discussed the indication for the procedure and the logistics, risks, potential benefit, and after care. I specifically explained that risks include but are not limited to infection, bleeding,damage to blood vessels, lung, and the heart -- but risk of prolonged hospitalization, need for surgery, or the event of stroke, heart attack, or death are low but not zero.  - hold xarelto, continue heparin - NPO after midnight Sunday night  For questions or updates, please contact [place] Please consult [email or web address] for contact info under Cardiology/STEMI.  Signed, Melida Quitter, MD  11/30/2022, 10:27 AM

## 2022-12-01 DIAGNOSIS — I442 Atrioventricular block, complete: Secondary | ICD-10-CM | POA: Diagnosis not present

## 2022-12-01 LAB — HEPARIN LEVEL (UNFRACTIONATED)
Heparin Unfractionated: 0.41 IU/mL (ref 0.30–0.70)
Heparin Unfractionated: 0.8 IU/mL — ABNORMAL HIGH (ref 0.30–0.70)
Heparin Unfractionated: 0.81 IU/mL — ABNORMAL HIGH (ref 0.30–0.70)

## 2022-12-01 LAB — CBC
HCT: 39.7 % (ref 39.0–52.0)
Hemoglobin: 13.6 g/dL (ref 13.0–17.0)
MCH: 30.4 pg (ref 26.0–34.0)
MCHC: 34.3 g/dL (ref 30.0–36.0)
MCV: 88.8 fL (ref 80.0–100.0)
Platelets: 213 10*3/uL (ref 150–400)
RBC: 4.47 MIL/uL (ref 4.22–5.81)
RDW: 13.3 % (ref 11.5–15.5)
WBC: 4.3 10*3/uL (ref 4.0–10.5)
nRBC: 0 % (ref 0.0–0.2)

## 2022-12-01 LAB — APTT
aPTT: 132 seconds — ABNORMAL HIGH (ref 24–36)
aPTT: 125 seconds — ABNORMAL HIGH (ref 24–36)

## 2022-12-01 MED ORDER — HEPARIN (PORCINE) 25000 UT/250ML-% IV SOLN: 900.0000 [IU]/h | INTRAVENOUS | Status: AC

## 2022-12-01 NOTE — Progress Notes (Signed)
ANTICOAGULATION CONSULT NOTE - Follow Up Consult  Pharmacy Consult for heparin Indication:  history of PE and atrial flutter  Allergies  Allergen Reactions   Morphine And Related Anaphylaxis   Gluten Meal Other (See Comments)    Prefers not to eat it   Scallops [Shellfish Allergy] Itching and Nausea And Vomiting    GI upset, also  Sea Bass specifically    Shellfish-Derived Products Itching and Nausea And Vomiting    GI upset, also    Heparin Dosing Weight: 103.5 kg  Vital Signs: Temp: 97.9 F (36.6 C) (02/18 1647) Temp Source: Oral (02/18 1647) BP: 141/75 (02/18 1650) Pulse Rate: 36 (02/18 1650)  Labs: Recent Labs    11/29/22 0229 11/30/22 1826 11/30/22 1826 12/01/22 0020 12/01/22 0832 12/01/22 1831  HGB 14.3  --   --  13.6  --   --   HCT 41.6  --   --  39.7  --   --   PLT 203  --   --  213  --   --   APTT  --  109*  --  132* 125*  --   HEPARINUNFRC  --  0.70   < > 0.80* 0.81* 0.41  CREATININE 1.26*  --   --   --   --   --    < > = values in this interval not displayed.     Estimated Creatinine Clearance: 74.6 mL/min (A) (by C-G formula based on SCr of 1.26 mg/dL (H)).   Medications:  Medications Prior to Admission  Medication Sig Dispense Refill Last Dose   ibuprofen (ADVIL,MOTRIN) 200 MG tablet Take 800 mg by mouth every 8 (eight) hours as needed (for pain.).   Past Month   losartan-hydrochlorothiazide (HYZAAR) 50-12.5 MG tablet Take 1 tablet by mouth daily.   11/28/2022 at 1900   Multiple Vitamin (MULTIVITAMIN WITH MINERALS) TABS tablet Take 1 tablet by mouth in the morning and at bedtime.   11/28/2022 at pm   rivaroxaban (XARELTO) 20 MG TABS tablet Take 1 tablet (20 mg total) by mouth daily with supper. 30 tablet 0 11/28/2022 at 1900   rosuvastatin (CRESTOR) 5 MG tablet Take 5 mg by mouth daily.   11/28/2022 at am   Scheduled:   losartan  50 mg Oral Daily   And   hydrochlorothiazide  12.5 mg Oral Daily   rosuvastatin  5 mg Oral Daily   Infusions:    heparin 900 Units/hr (12/01/22 1247)    Assessment: Patient presented with heart block and EP planning PPM implant on Monday. Patient on Xarelto PTA for history of PE and atrial flutter. Last dose of Xarelto PTA was 11/28/22. One dose ordered but not given on 2/16. Pharmacy consulted to dose heparin while Xarelto is on hold. Will scheduled to stop at midnight on Sunday for procedure Monday.  H/H & platelets are currently WNL. No bleeding noted. Will reduce dose and recheck once before holding at midnight. Heparin level and aPTT correlate and heparin level seems to be more reflective. Will discontinue aPTTs.  Heparin level came back therapeutic this PM. We will cont with current rate and check in AM.  Goal of Therapy:  Heparin level 0.3-0.7 units/ml Monitor platelets by anticoagulation protocol: Yes   Plan:  Cont heparin to 900 units/hr  Heparin level in AM Daily heparin level and CBC until levels correlate  Onnie Boer, PharmD, BCIDP, AAHIVP, CPP Infectious Disease Pharmacist 12/01/2022 7:14 PM

## 2022-12-01 NOTE — Progress Notes (Addendum)
ANTICOAGULATION CONSULT NOTE - Follow Up Consult  Pharmacy Consult for heparin Indication:  history of PE and atrial flutter  Allergies  Allergen Reactions   Morphine And Related Anaphylaxis   Gluten Meal Other (See Comments)    Prefers not to eat it   Scallops [Shellfish Allergy] Itching and Nausea And Vomiting    GI upset, also  Sea Bass specifically    Shellfish-Derived Products Itching and Nausea And Vomiting    GI upset, also    Heparin Dosing Weight: 103.5 kg  Vital Signs: Temp: 97.9 F (36.6 C) (02/18 0700) Temp Source: Oral (02/18 0700) BP: 130/83 (02/18 0700) Pulse Rate: 33 (02/18 0700)  Labs: Recent Labs    11/29/22 0229 11/30/22 1826 12/01/22 0020 12/01/22 0832  HGB 14.3  --  13.6  --   HCT 41.6  --  39.7  --   PLT 203  --  213  --   APTT  --  109* 132* 125*  HEPARINUNFRC  --  0.70 0.80* 0.81*  CREATININE 1.26*  --   --   --      Estimated Creatinine Clearance: 74.6 mL/min (A) (by C-G formula based on SCr of 1.26 mg/dL (H)).   Medications:  Medications Prior to Admission  Medication Sig Dispense Refill Last Dose   ibuprofen (ADVIL,MOTRIN) 200 MG tablet Take 800 mg by mouth every 8 (eight) hours as needed (for pain.).   Past Month   losartan-hydrochlorothiazide (HYZAAR) 50-12.5 MG tablet Take 1 tablet by mouth daily.   11/28/2022 at 1900   Multiple Vitamin (MULTIVITAMIN WITH MINERALS) TABS tablet Take 1 tablet by mouth in the morning and at bedtime.   11/28/2022 at pm   rivaroxaban (XARELTO) 20 MG TABS tablet Take 1 tablet (20 mg total) by mouth daily with supper. 30 tablet 0 11/28/2022 at 1900   rosuvastatin (CRESTOR) 5 MG tablet Take 5 mg by mouth daily.   11/28/2022 at am   Scheduled:   losartan  50 mg Oral Daily   And   hydrochlorothiazide  12.5 mg Oral Daily   rosuvastatin  5 mg Oral Daily   Infusions:   heparin 1,100 Units/hr (12/01/22 [id number])    Assessment: Patient presented with heart block and EP planning PPM implant on Monday. Patient on  Xarelto PTA for history of PE and atrial flutter. Last dose of Xarelto PTA was 11/28/22. One dose ordered but not given on 2/16. Pharmacy consulted to dose heparin while Xarelto is on hold. Will scheduled to stop at midnight on Sunday for procedure Monday.  H/H & platelets are currently WNL. No bleeding noted. Will reduce dose and recheck once before holding at midnight. Heparin level and aPTT correlate and heparin level seems to be more reflective. Will discontinue aPTTs.  Goal of Therapy:  Heparin level 0.3-0.7 units/ml Monitor platelets by anticoagulation protocol: Yes   Plan:  Hold heparin x 30 minutes given high aPTT and slightly elevated heparin level Decrease heparin to 900 units/hr (~2 units/kg) Heparin level in 8 hours Daily heparin level, aPTT, and CBC until levels correlate  Thank you for involving pharmacy in this patient's care.  Reatha Harps, PharmD PGY2 Pharmacy Resident 12/01/2022 10:44 AM

## 2022-12-01 NOTE — Progress Notes (Signed)
Electrophysiology Progress Note  Patient Name: Cameron Huerta Date of Encounter: 12/01/2022  Electrophysiologist: Dr. Caryl Comes   Subjective   Patient reports he is doing very well today. No recurrence of near-syncope or lightheadedness  Inpatient Medications    Scheduled Meds:  losartan  50 mg Oral Daily   And   hydrochlorothiazide  12.5 mg Oral Daily   rosuvastatin  5 mg Oral Daily   Continuous Infusions:  heparin 1,100 Units/hr (12/01/22 0213)   PRN Meds: acetaminophen, ondansetron (ZOFRAN) IV   Vital Signs    Vitals:   11/30/22 2343 12/01/22 0000 12/01/22 0348 12/01/22 0700  BP:  121/74 (!) 148/76 130/83  Pulse:  (!) 33  (!) 33  Resp:  15  13  Temp: 97.6 F (36.4 C)  97.6 F (36.4 C) 97.9 F (36.6 C)  TempSrc: Oral  Oral Oral  SpO2:    97%  Weight:      Height:        Intake/Output Summary (Last 24 hours) at 12/01/2022 0802 Last data filed at 12/01/2022 0700 Gross per 24 hour  Intake 615.34 ml  Output 350 ml  Net 265.34 ml   Filed Weights   11/30/22 0709  Weight: 105.1 kg    Physical Exam    GEN- The patient is well appearing, alert and oriented x 3 today.   Head- normocephalic, atraumatic Eyes-  Sclera clear, conjunctiva pink Ears- hearing intact Oropharynx- clear Neck- supple Lungs- Clear to ausculation bilaterally, normal work of breathing Heart- Regular rate and rhythm, no murmurs, rubs or gallops GI- soft, NT, ND, + BS Extremities- no clubbing, cyanosis, or edema Skin- no rash or lesion Psych- euthymic mood, full affect Neuro- strength and sensation are intact  Labs    CBC Recent Labs    11/29/22 0229 12/01/22 0020  WBC 3.7* 4.3  NEUTROABS 1.6*  --   HGB 14.3 13.6  HCT 41.6 39.7  MCV 89.8 88.8  PLT 203 123456   Basic Metabolic Panel Recent Labs    11/29/22 0229  NA 139  K 3.9  CL 99  CO2 29  GLUCOSE 93  BUN 13  CREATININE 1.26*  CALCIUM 9.3  MG 2.2   Liver Function Tests No results for input(s): "AST", "ALT",  "ALKPHOS", "BILITOT", "PROT", "ALBUMIN" in the last 72 hours. No results for input(s): "LIPASE", "AMYLASE" in the last 72 hours. Cardiac Enzymes No results for input(s): "CKTOTAL", "CKMB", "CKMBINDEX", "TROPONINI" in the last 72 hours. BNP Invalid input(s): "POCBNP" D-Dimer No results for input(s): "DDIMER" in the last 72 hours. Hemoglobin A1C No results for input(s): "HGBA1C" in the last 72 hours. Fasting Lipid Panel No results for input(s): "CHOL", "HDL", "LDLCALC", "TRIG", "CHOLHDL", "LDLDIRECT" in the last 72 hours. Thyroid Function Tests Recent Labs    11/29/22 0528  TSH 1.198    Telemetry    Sinus rhythm with Complete heart block and narrow complex escape  Radiology    ECHOCARDIOGRAM COMPLETE  Result Date: 11/29/2022    ECHOCARDIOGRAM REPORT   Patient Name:   Cameron Huerta Date of Exam: 11/29/2022 Medical Rec #:  [id number]    Height:       74.0 in Accession #:    [id number]   Weight:       231.8 lb Date of Birth:  [birth year]    BSA:          2.314 m Patient Age:    67 years     BP:  132/66 mmHg Patient Gender: M            HR:           37 bpm. Exam Location:  Inpatient Procedure: 2D Echo, Cardiac Doppler and Color Doppler Indications:    Heart block, Complete I44.2  History:        Patient has prior history of Echocardiogram examinations, most                 recent 06/04/2018. Arrythmias:CHB and Atrial Flutter,                 Signs/Symptoms:Syncope; Risk Factors:Non-Smoker.  Sonographer:    Greer Pickerel Referring Phys: [id number] Shirley Friar  Sonographer Comments: Suboptimal subcostal window. Image acquisition challenging due to respiratory motion. IMPRESSIONS  1. Left ventricular ejection fraction, by estimation, is 60 to 65%. The left ventricle has normal function. The left ventricle has no regional wall motion abnormalities. Left ventricular diastolic parameters were normal.  2. Right ventricular systolic function is normal. The right ventricular size is normal.  There is normal pulmonary artery systolic pressure.  3. Left atrial size was mildly dilated.  4. The mitral valve is normal in structure. No evidence of mitral valve regurgitation. No evidence of mitral stenosis.  5. The aortic valve is tricuspid. Aortic valve regurgitation is mild to moderate. No aortic stenosis is present.  6. The inferior vena cava is normal in size with greater than 50% respiratory variability, suggesting right atrial pressure of 3 mmHg. FINDINGS  Left Ventricle: Left ventricular ejection fraction, by estimation, is 60 to 65%. The left ventricle has normal function. The left ventricle has no regional wall motion abnormalities. The left ventricular internal cavity size was normal in size. There is  no left ventricular hypertrophy. Left ventricular diastolic parameters were normal. Right Ventricle: The right ventricular size is normal. No increase in right ventricular wall thickness. Right ventricular systolic function is normal. There is normal pulmonary artery systolic pressure. The tricuspid regurgitant velocity is 1.76 m/s, and  with an assumed right atrial pressure of 3 mmHg, the estimated right ventricular systolic pressure is 123XX123 mmHg. Left Atrium: Left atrial size was mildly dilated. Right Atrium: Right atrial size was normal in size. Pericardium: There is no evidence of pericardial effusion. Mitral Valve: The mitral valve is normal in structure. No evidence of mitral valve regurgitation. No evidence of mitral valve stenosis. Tricuspid Valve: The tricuspid valve is normal in structure. Tricuspid valve regurgitation is trivial. No evidence of tricuspid stenosis. Aortic Valve: The aortic valve is tricuspid. Aortic valve regurgitation is mild to moderate. Aortic regurgitation PHT measures 1714 msec. No aortic stenosis is present. Pulmonic Valve: The pulmonic valve was normal in structure. Pulmonic valve regurgitation is trivial. No evidence of pulmonic stenosis. Aorta: The aortic root is  normal in size and structure. Venous: The inferior vena cava is normal in size with greater than 50% respiratory variability, suggesting right atrial pressure of 3 mmHg. IAS/Shunts: No atrial level shunt detected by color flow Doppler.  LEFT VENTRICLE PLAX 2D LVIDd:         5.30 cm   Diastology LVIDs:         3.20 cm   LV e' medial:    5.07 cm/s LV PW:         1.10 cm   LV E/e' medial:  16.1 LV IVS:        1.00 cm   LV e' lateral:   14.80 cm/s LVOT diam:  2.40 cm   LV E/e' lateral: 5.5 LV SV:         114 LV SV Index:   49 LVOT Area:     4.52 cm  RIGHT VENTRICLE RV S prime:     11.50 cm/s TAPSE (M-mode): 1.9 cm LEFT ATRIUM             Index        RIGHT ATRIUM           Index LA diam:        3.30 cm 1.43 cm/m   RA Area:     22.30 cm LA Vol (A2C):   84.9 ml 36.69 ml/m  RA Volume:   64.10 ml  27.70 ml/m LA Vol (A4C):   72.2 ml 31.20 ml/m LA Biplane Vol: 80.6 ml 34.83 ml/m  AORTIC VALVE             PULMONIC VALVE LVOT Vmax:   118.00 cm/s PR End Diast Vel: 12.53 msec LVOT Vmean:  65.700 cm/s LVOT VTI:    0.251 m AI PHT:      1714 msec  AORTA Ao Root diam: 3.80 cm Ao Asc diam:  3.90 cm MITRAL VALVE               TRICUSPID VALVE MV Area (PHT): 2.83 cm    TR Peak grad:   12.4 mmHg MV Decel Time: 268 msec    TR Vmax:        176.00 cm/s MR Peak grad: 7.4 mmHg MR Vmax:      136.00 cm/s  SHUNTS MV E velocity: 81.70 cm/s  Systemic VTI:  0.25 m MV A velocity: 80.50 cm/s  Systemic Diam: 2.40 cm MV E/A ratio:  1.01 Candee Furbish MD Electronically signed by Candee Furbish MD Signature Date/Time: 11/29/2022/3:18:44 PM    Final      Patient Profile     Cameron Huerta is a 67 y.o. male with a past medical history significant for HTN, first and second degree AV block.  He was admitted for near syncope and complete heart block.   Assessment & Plan    #Heart block: Mobitz I pattern this AM, frequently with 2:1 block. CHB on presentation with symptoms of pre-syncope. - plan for possible pacemaker placement tomorrow. I  explained risks and benefits yesterday.  - hold xarelto, continue heparin. Stop - NPO after midnight Sunday night  #history of PE: on rivaroxaban indefinitely, held for procedure. On heparin currently pending procedure tomorrow.  For questions or updates, please contact [place] Please consult [email or web address] for contact info under Cardiology/STEMI.  Signed, Melida Quitter, MD  12/01/2022, 8:02 AM

## 2022-12-01 NOTE — Progress Notes (Signed)
ANTICOAGULATION CONSULT NOTE - Follow Up Consult  Pharmacy Consult for heparin Indication:  h/o PE/Aflutter  Labs: Recent Labs    11/29/22 0229 11/30/22 1826 12/01/22 0020  HGB 14.3  --  13.6  HCT 41.6  --  39.7  PLT 203  --  213  APTT  --  109* 132*  HEPARINUNFRC  --  0.70 0.80*  CREATININE 1.26*  --   --     Assessment: 67yo male supratherapeutic on heparin with higher heparin level despite decreased rate; no infusion issues or signs of bleeding per RN.  Goal of Therapy:  Heparin level 0.3-0.7 units/ml aPTT 66-102 seconds   Plan:  Will decrease heparin infusion by 2 units/kg/hr to 1100 units/hr and check level in 6 hours.    Wynona Neat, PharmD, BCPS  12/01/2022,1:53 AM

## 2022-12-02 ENCOUNTER — Inpatient Hospital Stay (HOSPITAL_COMMUNITY)
Admission: EM | Disposition: A | Discharge status: Home / Self Care | Payer: Self-pay | Source: Home / Self Care | Attending: Internal Medicine

## 2022-12-02 DIAGNOSIS — I442 Atrioventricular block, complete: Secondary | ICD-10-CM

## 2022-12-02 HISTORY — PX: PACEMAKER IMPLANT: EP1218

## 2022-12-02 LAB — BASIC METABOLIC PANEL
Anion gap: 9 (ref 5–15)
BUN: 18 mg/dL (ref 8–23)
CO2: 28 mmol/L (ref 22–32)
Calcium: 8.9 mg/dL (ref 8.9–10.3)
Chloride: 99 mmol/L (ref 98–111)
Creatinine, Ser: 1.22 mg/dL (ref 0.61–1.24)
GFR, Estimated: >60 mL/min (ref >60)
Glucose, Bld: 94 mg/dL (ref 70–99)
Potassium: 3.8 mmol/L (ref 3.5–5.1)
Sodium: 136 mmol/L (ref 135–145)

## 2022-12-02 LAB — CBC
HCT: 40.9 % (ref 39.0–52.0)
Hemoglobin: 14.1 g/dL (ref 13.0–17.0)
MCH: 30.5 pg (ref 26.0–34.0)
MCHC: 34.5 g/dL (ref 30.0–36.0)
MCV: 88.3 fL (ref 80.0–100.0)
Platelets: 221 10*3/uL (ref 150–400)
RBC: 4.63 MIL/uL (ref 4.22–5.81)
RDW: 13.2 % (ref 11.5–15.5)
WBC: 4.2 10*3/uL (ref 4.0–10.5)
nRBC: 0 % (ref 0.0–0.2)

## 2022-12-02 LAB — PSA: Prostatic Specific Antigen: 8.43 ng/mL — ABNORMAL HIGH (ref 0.00–4.00)

## 2022-12-02 LAB — HEPARIN LEVEL (UNFRACTIONATED): Heparin Unfractionated: 0.41 IU/mL (ref 0.30–0.70)

## 2022-12-02 LAB — SURGICAL PCR SCREEN
MRSA, PCR: NEGATIVE
Staphylococcus aureus: NEGATIVE

## 2022-12-02 SURGERY — PACEMAKER IMPLANT

## 2022-12-02 MED ORDER — ONDANSETRON HCL 4 MG/2ML IJ SOLN: 4.0000 mg | Freq: Four times a day (QID) | INTRAMUSCULAR | Status: DC | PRN

## 2022-12-02 MED ORDER — LIDOCAINE HCL (PF) 1 % IJ SOLN
INTRAMUSCULAR | Status: DC | PRN
  Administered 2022-12-02: 50 mL

## 2022-12-02 MED ORDER — SODIUM CHLORIDE 0.9% FLUSH
3.0000 mL | Freq: Two times a day (BID) | INTRAVENOUS | Status: DC
  Administered 2022-12-02 – 2022-12-03 (×3): 3 mL via INTRAVENOUS

## 2022-12-02 MED ORDER — SODIUM CHLORIDE 0.9% FLUSH: 3.0000 mL | INTRAVENOUS | Status: DC | PRN

## 2022-12-02 MED ORDER — MIDAZOLAM HCL 5 MG/5ML IJ SOLN
INTRAMUSCULAR | Status: AC
  Filled 2022-12-02: qty 5

## 2022-12-02 MED ORDER — FENTANYL CITRATE (PF) 100 MCG/2ML IJ SOLN
INTRAMUSCULAR | Status: DC | PRN
  Administered 2022-12-02 (×2): 12.5 ug via INTRAVENOUS

## 2022-12-02 MED ORDER — SODIUM CHLORIDE 0.9 % IV SOLN
INTRAVENOUS | Status: AC
  Filled 2022-12-02: qty 2

## 2022-12-02 MED ORDER — SODIUM CHLORIDE 0.9 % IV SOLN
INTRAVENOUS | Status: DC
  Administered 2022-12-02: 50 mL/h via INTRAVENOUS

## 2022-12-02 MED ORDER — SODIUM CHLORIDE 0.9 % IV SOLN
80.0000 mg | INTRAVENOUS | Status: AC
  Administered 2022-12-02: 80 mg

## 2022-12-02 MED ORDER — HEPARIN (PORCINE) IN NACL 1000-0.9 UT/500ML-% IV SOLN
INTRAVENOUS | Status: DC | PRN
  Administered 2022-12-02: 500 mL

## 2022-12-02 MED ORDER — CHLORHEXIDINE GLUCONATE 4 % EX LIQD: 60.0000 mL | Freq: Once | CUTANEOUS | Status: AC

## 2022-12-02 MED ORDER — MIDAZOLAM HCL 5 MG/5ML IJ SOLN
INTRAMUSCULAR | Status: DC | PRN
  Administered 2022-12-02 (×2): 1 mg via INTRAVENOUS

## 2022-12-02 MED ORDER — CEFAZOLIN SODIUM-DEXTROSE 2-3 GM-%(50ML) IV SOLR
INTRAVENOUS | Status: AC | PRN
  Administered 2022-12-02: 2 g via INTRAVENOUS

## 2022-12-02 MED ORDER — ORAL CARE MOUTH RINSE: 15.0000 mL | OROMUCOSAL | Status: DC | PRN

## 2022-12-02 MED ORDER — LIDOCAINE HCL (PF) 1 % IJ SOLN
INTRAMUSCULAR | Status: AC
  Filled 2022-12-02: qty 60

## 2022-12-02 MED ORDER — ACETAMINOPHEN 325 MG PO TABS: 325.0000 mg | ORAL_TABLET | ORAL | Status: DC | PRN

## 2022-12-02 MED ORDER — SODIUM CHLORIDE 0.9 % IV SOLN: 250.0000 mL | INTRAVENOUS | Status: DC

## 2022-12-02 MED ORDER — CEFAZOLIN SODIUM-DEXTROSE 2-4 GM/100ML-% IV SOLN
INTRAVENOUS | Status: AC
  Filled 2022-12-02: qty 100

## 2022-12-02 MED ORDER — CEFAZOLIN SODIUM-DEXTROSE 1-4 GM/50ML-% IV SOLN
1.0000 g | Freq: Four times a day (QID) | INTRAVENOUS | Status: DC
  Administered 2022-12-03 (×2): 1 g via INTRAVENOUS
  Filled 2022-12-02 (×3): qty 50

## 2022-12-02 MED ORDER — FENTANYL CITRATE (PF) 100 MCG/2ML IJ SOLN
INTRAMUSCULAR | Status: AC
  Filled 2022-12-02: qty 2

## 2022-12-02 MED ORDER — CEFAZOLIN SODIUM-DEXTROSE 2-4 GM/100ML-% IV SOLN: 2.0000 g | INTRAVENOUS | Status: AC

## 2022-12-02 SURGICAL SUPPLY — 11 items
CABLE SURGICAL S-101-97-12 (CABLE) ×1 IMPLANT
KIT ACCESSORY SELECTRA FIX CVD (MISCELLANEOUS) IMPLANT
LEAD SELECTRA 3D-55-42 (CATHETERS) IMPLANT
LEAD SELECTRA 3D-65-42 (CATHETERS) IMPLANT
LEAD SOLIA S PRO MRI 53 (Lead) IMPLANT
LEAD SOLIA S PRO MRI 60 (Lead) IMPLANT
PACEMAKER EDORA 8DR-T MRI (Pacemaker) IMPLANT
PAD DEFIB RADIO PHYSIO CONN (PAD) ×1 IMPLANT
SHEATH 7FR PRELUDE SNAP 13 (SHEATH) IMPLANT
SHEATH 9FR PRELUDE SNAP 13 (SHEATH) IMPLANT
TRAY PACEMAKER INSERTION (PACKS) ×1 IMPLANT

## 2022-12-02 NOTE — Progress Notes (Addendum)
Rounding Note    Patient Name: Cameron Huerta Date of Encounter: 12/02/2022  [place] Cardiologist: Virl Axe, MD   Subjective   Feels well, no symptoms at rest, no CP, SOB  Inpatient Medications    Scheduled Meds:  losartan  50 mg Oral Daily   And   hydrochlorothiazide  12.5 mg Oral Daily   rosuvastatin  5 mg Oral Daily   Continuous Infusions:  PRN Meds: acetaminophen, ondansetron (ZOFRAN) IV, mouth rinse   Vital Signs    Vitals:   12/01/22 1650 12/01/22 1925 12/01/22 2338 12/02/22 0356  BP: (!) 141/75 135/66 (!) 150/72 (!) 140/68  Pulse: (!) 36 (!) 37 (!) 32 (!) 29  Resp: 13 17 16 15  $ Temp:  98 F (36.7 C) 97.6 F (36.4 C) 97.6 F (36.4 C)  TempSrc:  Oral Oral Oral  SpO2: 95% 95% 94% 96%  Weight:      Height:        Intake/Output Summary (Last 24 hours) at 12/02/2022 0655 Last data filed at 12/02/2022 0357 Gross per 24 hour  Intake 989.64 ml  Output 700 ml  Net 289.64 ml      11/30/2022    7:09 AM 10/28/2022    8:52 AM 10/28/2022    8:39 AM  Last 3 Weights  Weight (lbs) 231 lb 11.3 oz 231 lb 12.8 oz 231 lb 12.8 oz  Weight (kg) 105.1 kg 105.144 kg 105.144 kg      Telemetry    Advanced heart block, 2:1 and CHB, Rates 30's - Personally Reviewed  ECG    No new EKGs - Personally Reviewed  Physical Exam   GEN: No acute distress.   Neck: No JVD Cardiac: RRR, bradycardic, no murmurs, rubs, or gallops.  Respiratory: CTA b/l. GI: Soft, nontender, non-distended  MS: No edema; No deformity. Neuro:  Nonfocal  Psych: Normal affect   Labs    High Sensitivity Troponin:  No results for input(s): "TROPONINIHS" in the last 720 hours.   Chemistry Recent Labs  Lab 11/29/22 0229 12/02/22 0023  NA 139 136  K 3.9 3.8  CL 99 99  CO2 29 28  GLUCOSE 93 94  BUN 13 18  CREATININE 1.26* 1.22  CALCIUM 9.3 8.9  MG 2.2  --   GFRNONAA >60 >60  ANIONGAP 11 9    Lipids No results for input(s): "CHOL", "TRIG", "HDL", "LABVLDL", "LDLCALC",  "CHOLHDL" in the last 168 hours.  Hematology Recent Labs  Lab 11/29/22 0229 12/01/22 0020 12/02/22 0023  WBC 3.7* 4.3 4.2  RBC 4.63 4.47 4.63  HGB 14.3 13.6 14.1  HCT 41.6 39.7 40.9  MCV 89.8 88.8 88.3  MCH 30.9 30.4 30.5  MCHC 34.4 34.3 34.5  RDW 13.3 13.3 13.2  PLT 203 213 221   Thyroid  Recent Labs  Lab 11/29/22 0528 11/29/22 0908  TSH 1.198  --   FREET4  --  0.89    BNPNo results for input(s): "BNP", "PROBNP" in the last 168 hours.  DDimer No results for input(s): "DDIMER" in the last 168 hours.   Radiology    No results found.  Cardiac Studies   11/29/22: TTE  1. Left ventricular ejection fraction, by estimation, is 60 to 65%. The  left ventricle has normal function. The left ventricle has no regional  wall motion abnormalities. Left ventricular diastolic parameters were  normal.   2. Right ventricular systolic function is normal. The right ventricular  size is normal. There is normal pulmonary  artery systolic pressure.   3. Left atrial size was mildly dilated.   4. The mitral valve is normal in structure. No evidence of mitral valve  regurgitation. No evidence of mitral stenosis.   5. The aortic valve is tricuspid. Aortic valve regurgitation is mild to  moderate. No aortic stenosis is present.   6. The inferior vena cava is normal in size with greater than 50%  respiratory variability, suggesting right atrial pressure of 3 mmHg.   Patient Profile     67 y.o. 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 Will order   For questions or updates, please contact [place] Please consult [email or web address] for contact info under        Signed, Baldwin Jamaica, PA-C  12/02/2022, 6:55 AM    (As above) Reviewed --hx consistent with post micturition syncope in context of AV nodal block, WKB; 2:1 and complete heart block  Will use biotronik ; questions answered

## 2022-12-02 NOTE — Progress Notes (Signed)
Patient chest shaved, CHG.

## 2022-12-02 NOTE — H&P (View-Only) (Signed)
Rounding Note    Patient Name: Cameron Huerta Date of Encounter: 12/02/2022  [place] Cardiologist: Virl Axe, MD   Subjective   Feels well, no symptoms at rest, no CP, SOB  Inpatient Medications    Scheduled Meds:  losartan  50 mg Oral Daily   And   hydrochlorothiazide  12.5 mg Oral Daily   rosuvastatin  5 mg Oral Daily   Continuous Infusions:  PRN Meds: acetaminophen, ondansetron (ZOFRAN) IV, mouth rinse   Vital Signs    Vitals:   12/01/22 1650 12/01/22 1925 12/01/22 2338 12/02/22 0356  BP: (!) 141/75 135/66 (!) 150/72 (!) 140/68  Pulse: (!) 36 (!) 37 (!) 32 (!) 29  Resp: 13 17 16 15  $ Temp:  98 F (36.7 C) 97.6 F (36.4 C) 97.6 F (36.4 C)  TempSrc:  Oral Oral Oral  SpO2: 95% 95% 94% 96%  Weight:      Height:        Intake/Output Summary (Last 24 hours) at 12/02/2022 0655 Last data filed at 12/02/2022 0357 Gross per 24 hour  Intake 989.64 ml  Output 700 ml  Net 289.64 ml      11/30/2022    7:09 AM 10/28/2022    8:52 AM 10/28/2022    8:39 AM  Last 3 Weights  Weight (lbs) 231 lb 11.3 oz 231 lb 12.8 oz 231 lb 12.8 oz  Weight (kg) 105.1 kg 105.144 kg 105.144 kg      Telemetry    Advanced heart block, 2:1 and CHB, Rates 30's - Personally Reviewed  ECG    No new EKGs - Personally Reviewed  Physical Exam   GEN: No acute distress.   Neck: No JVD Cardiac: RRR, bradycardic, no murmurs, rubs, or gallops.  Respiratory: CTA b/l. GI: Soft, nontender, non-distended  MS: No edema; No deformity. Neuro:  Nonfocal  Psych: Normal affect   Labs    High Sensitivity Troponin:  No results for input(s): "TROPONINIHS" in the last 720 hours.   Chemistry Recent Labs  Lab 11/29/22 0229 12/02/22 0023  NA 139 136  K 3.9 3.8  CL 99 99  CO2 29 28  GLUCOSE 93 94  BUN 13 18  CREATININE 1.26* 1.22  CALCIUM 9.3 8.9  MG 2.2  --   GFRNONAA >60 >60  ANIONGAP 11 9    Lipids No results for input(s): "CHOL", "TRIG", "HDL", "LABVLDL", "LDLCALC",  "CHOLHDL" in the last 168 hours.  Hematology Recent Labs  Lab 11/29/22 0229 12/01/22 0020 12/02/22 0023  WBC 3.7* 4.3 4.2  RBC 4.63 4.47 4.63  HGB 14.3 13.6 14.1  HCT 41.6 39.7 40.9  MCV 89.8 88.8 88.3  MCH 30.9 30.4 30.5  MCHC 34.4 34.3 34.5  RDW 13.3 13.3 13.2  PLT 203 213 221   Thyroid  Recent Labs  Lab 11/29/22 0528 11/29/22 0908  TSH 1.198  --   FREET4  --  0.89    BNPNo results for input(s): "BNP", "PROBNP" in the last 168 hours.  DDimer No results for input(s): "DDIMER" in the last 168 hours.   Radiology    No results found.  Cardiac Studies   11/29/22: TTE  1. Left ventricular ejection fraction, by estimation, is 60 to 65%. The  left ventricle has normal function. The left ventricle has no regional  wall motion abnormalities. Left ventricular diastolic parameters were  normal.   2. Right ventricular systolic function is normal. The right ventricular  size is normal. There is normal pulmonary  artery systolic pressure.   3. Left atrial size was mildly dilated.   4. The mitral valve is normal in structure. No evidence of mitral valve  regurgitation. No evidence of mitral stenosis.   5. The aortic valve is tricuspid. Aortic valve regurgitation is mild to  moderate. No aortic stenosis is present.   6. The inferior vena cava is normal in size with greater than 50%  respiratory variability, suggesting right atrial pressure of 3 mmHg.   Patient Profile     67 y.o. 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 Will order   For questions or updates, please contact [place] Please consult [email or web address] for contact info under        Signed, Baldwin Jamaica, PA-C  12/02/2022, 6:55 AM    (As above) Reviewed --hx consistent with post micturition syncope in context of AV nodal block, WKB; 2:1 and complete heart block  Will use biotronik ; questions answered

## 2022-12-02 NOTE — Interval H&P Note (Signed)
History and Physical Interval Note:  12/02/2022 5:17 PM  Cameron Huerta  has presented today for surgery, with the diagnosis of syncope,heart block.  The various methods of treatment have been discussed with the patient and family. After consideration of risks, benefits and other options for treatment, the patient has consented to  Procedure(s): PACEMAKER IMPLANT (N/A) as a surgical intervention.  The patient's history has been reviewed, patient examined, no change in status, stable for surgery.  I have reviewed the patient's chart and labs.  Questions were answered to the patient's satisfaction.     Cristopher Peru

## 2022-12-02 NOTE — Progress Notes (Signed)
  Transition of Care [facility]) Screening Note   Patient Details  Name: Cameron Huerta Date of Birth: 01-19-1956   Transition of Care [facility]) CM/SW Contact:    Cyndi Bender, RN Phone Number: 12/02/2022, 2:28 PM    Transition of Care Department [facility]) has reviewed patient and no TOC needs have been identified at this time. We will continue to monitor patient advancement through interdisciplinary progression rounds. If new patient transition needs arise, please place a TOC consult.

## 2022-12-03 ENCOUNTER — Inpatient Hospital Stay (HOSPITAL_COMMUNITY): Payer: Medicare PPO

## 2022-12-03 ENCOUNTER — Other Ambulatory Visit: Payer: Self-pay

## 2022-12-03 ENCOUNTER — Encounter (HOSPITAL_COMMUNITY): Payer: Self-pay | Admitting: Internal Medicine

## 2022-12-03 DIAGNOSIS — I442 Atrioventricular block, complete: Secondary | ICD-10-CM | POA: Diagnosis not present

## 2022-12-03 LAB — CBC
HCT: 41.8 % (ref 39.0–52.0)
Hemoglobin: 14.2 g/dL (ref 13.0–17.0)
MCH: 30.2 pg (ref 26.0–34.0)
MCHC: 34 g/dL (ref 30.0–36.0)
MCV: 88.9 fL (ref 80.0–100.0)
Platelets: 191 10*3/uL (ref 150–400)
RBC: 4.7 MIL/uL (ref 4.22–5.81)
RDW: 12.9 % (ref 11.5–15.5)
WBC: 3.9 10*3/uL — ABNORMAL LOW (ref 4.0–10.5)
nRBC: 0 % (ref 0.0–0.2)

## 2022-12-03 LAB — HEPARIN LEVEL (UNFRACTIONATED): Heparin Unfractionated: <0.1 IU/mL — ABNORMAL LOW (ref 0.30–0.70)

## 2022-12-03 MED FILL — Cefazolin Sodium For Inj 2 GM: INTRAMUSCULAR | Qty: 2 | Status: AC

## 2022-12-03 NOTE — Progress Notes (Signed)
Orthopedic Tech Progress Note Patient Details:  Cameron Huerta 11/01/1955 [id number]  Patient ID: [name], male   DOB: 1956-05-19, 67 y.o.   MRN: [id number] I spoke with patient Rn they said that the patient has an arm sling. Karolee Stamps 12/03/2022, 2:37 AM

## 2022-12-03 NOTE — Progress Notes (Signed)
Patient and wife provided with discharge instructions and arm mobility restrictions post pacemaker. Questions answered and follow up appointments discussed. Patient discharged in wheelchair with all belongings.

## 2022-12-03 NOTE — Discharge Summary (Addendum)
DISCHARGE SUMMARY    Patient ID: Cameron Huerta,  MRN: [id number], DOB/AGE: 1956-08-06 67 y.o.  Admit date: 11/29/2022 Discharge date: 12/03/2022  Primary Care Physician: Seward Carol, MD  EP: Dr. Caryl Comes  Primary Discharge Diagnosis:  Near syncope CHB  Secondary Discharge Diagnosis:  AFlutter  Ablated remotely, maintained on [place] 2/2 below Remote PE  Allergies  Allergen Reactions   Morphine And Related Anaphylaxis   Gluten Meal Other (See Comments)    Prefers not to eat it   Scallops [Shellfish Allergy] Itching and Nausea And Vomiting    GI upset, also  Sea Bass specifically    Shellfish-Derived Products Itching and Nausea And Vomiting    GI upset, also     Procedures This Admission:  1.  Implantation of a biotronik dual chamber PPM on 12/02/22 by Dr Lovena Le.   There were no immediate post procedure complications. CXR on 12/03/22 demonstrated no pneumothorax status post device implantation.   Brief HPI: Cameron Huerta is a 67 y.o. male w/PMHx as above, admitted with near syncope and CHB.  Hospital Course:  The patient was admitted labs unrevealing and no reversible causes found.  Dr. Caryl Comes felt post micturition syncope, though in light of advanced heart block PPM indicated.  LVEF 60-65% by update TTE. and underwent implantation of a PPM with details as outlined in his procedure report.  He was monitored on telemetry throughout his stay noting CHB > AV paced rhythm.  BP remained stable through his stay.  Left chest was without hematoma or ecchymosis.  The device was interrogated and found to be functioning normally.  CXR was obtained and demonstrated no pneumothorax status post device implantation.  Wound care, arm mobility, and restrictions were reviewed with the patient.  The patient feels well, denies any CP/SOB, with minimal site discomfort.   He was examined by Dr. Lovena Le and considered stable for discharge to home.    His wife requested update PSA drawn here since  her missed it with his urologist.  They were provided with the result.  Resume Xarelto Monday 12/09/22   Physical Exam: Vitals:   12/02/22 1900 12/02/22 1924 12/03/22 0031 12/03/22 0309  BP: (!) 149/82 (!) 149/90 (!) 155/88 139/84  Pulse: 66 (!) 58 60 60  Resp: 16 19 20 10  $ Temp:  98.2 F (36.8 C) 98.4 F (36.9 C) 98.5 F (36.9 C)  TempSrc:  Oral Oral Oral  SpO2: 100% 98% 95% 95%  Weight:      Height:        GEN- The patient is well appearing, alert and oriented x 3 today.   HEENT: normocephalic, atraumatic; sclera clear, conjunctiva pink; hearing intact; oropharynx clear; neck supple, no JVP Lungs-  CTA b/l, normal work of breathing.  No wheezes, rales, rhonchi Heart- RRR, no murmurs, rubs or gallops, PMI not laterally displaced GI- soft, non-tender, non-distended Extremities- no clubbing, cyanosis, or edema MS- no significant deformity or atrophy Skin- warm and dry, no rash or lesion, left chest without hematoma/ecchymosis Psych- euthymic mood, full affect Neuro- no gross deficits   Labs:   Lab Results  Component Value Date   WBC 3.9 (L) 12/03/2022   HGB 14.2 12/03/2022   HCT 41.8 12/03/2022   MCV 88.9 12/03/2022   PLT 191 12/03/2022    Recent Labs  Lab 12/02/22 0023  NA 136  K 3.8  CL 99  CO2 28  BUN 18  CREATININE 1.22  CALCIUM 8.9  GLUCOSE 94  Discharge Medications:  Allergies as of 12/03/2022       Reactions   Morphine And Related Anaphylaxis   Gluten Meal Other (See Comments)   Prefers not to eat it   Scallops [shellfish Allergy] Itching, Nausea And Vomiting   GI upset, also Sea Bass specifically    Shellfish-derived Products Itching, Nausea And Vomiting   GI upset, also        Medication List     TAKE these medications    ibuprofen 200 MG tablet Commonly known as: ADVIL Take 800 mg by mouth every 8 (eight) hours as needed (for pain.).   losartan-hydrochlorothiazide 50-12.5 MG tablet Commonly known as: HYZAAR Take 1 tablet by  mouth daily.   multivitamin with minerals Tabs tablet Take 1 tablet by mouth in the morning and at bedtime.   rivaroxaban 20 MG Tabs tablet Commonly known as: XARELTO Take 1 tablet (20 mg total) by mouth daily with supper. Notes to patient: Do not resume until Monday 12/09/22   rosuvastatin 5 MG tablet Commonly known as: CRESTOR Take 5 mg by mouth daily.        Disposition: Home Discharge Instructions     Diet - low sodium heart healthy   Complete by: As directed    Increase activity slowly   Complete by: As directed         Duration of Discharge Encounter: Greater than 30 minutes including physician time.  Venetia Night, PA-C 12/03/2022 11:26 AM  EP Attending  Patient seen and examined. Agree with above. The patient is doing well after DDD PM insertion. His CXR looks good and interrogation of his DDD PM demonstrates normal DDD PM function. He will be discharged home with usual followup.   Carleene Overlie Sarh Kirschenbaum,MD

## 2022-12-03 NOTE — Care Management Important Message (Signed)
Important Message  Patient Details  Name: CANDICE MISNER MRN: [id number] Date of Birth: 16-Dec-1955   Medicare Important Message Given:  Yes  Patient left prior to IM delivery will send copy to the patient home address.    Gaven Eugene 12/03/2022, 1:39 PM

## 2022-12-03 NOTE — Discharge Instructions (Addendum)
     Supplemental Discharge Instructions for  Pacemaker/Defibrillator Patients    Activity No heavy lifting or vigorous activity with your left/right arm for 6 to 8 weeks.  Do not raise your left/right arm above your head for one week.  Gradually raise your affected arm as drawn below.             12/07/22                    12/08/22                    12/09/22                  12/10/22 __  NO DRIVING for  1 week   ; you may begin driving on  [id number]  .  WOUND CARE Keep the wound area clean and dry.  Do not get this area wet , no showers for one week; you may shower on  12/10/25   . The tape/steri-strips on your wound will fall off; do not pull them off.  No bandage is needed on the site.  DO  NOT apply any creams, oils, or ointments to the wound area. If you notice any drainage or discharge from the wound, any swelling or bruising at the site, or you develop a fever > 101? F after you are discharged home, call the office at once.  Special Instructions You are still able to use cellular telephones; use the ear opposite the side where you have your pacemaker/defibrillator.  Avoid carrying your cellular phone near your device. When traveling through airports, show security personnel your identification card to avoid being screened in the metal detectors.  Ask the security personnel to use the hand wand. Avoid arc welding equipment, MRI testing (magnetic resonance imaging), TENS units (transcutaneous nerve stimulators).  Call the office for questions about other devices. Avoid electrical appliances that are in poor condition or are not properly grounded. Microwave ovens are safe to be near or to operate.

## 2022-12-03 NOTE — Progress Notes (Signed)
Mobility Specialist Progress Note:   12/03/22 1048  Mobility  Activity Ambulated with assistance in hallway  Level of Assistance Independent  Assistive Device Sling  Distance Ambulated (ft) 400 ft  Activity Response Tolerated well  $Mobility charge 1 Mobility   Pre- Mobility:  69 HR During Mobility: 80 HR Post Mobility:  70 HR  Pt in bed willing to participate in mobility. No complaints of pain. Left in bed with call bell in reach and all needs met.   Cameron Huerta Jaeley Wiker Mobility Specialist Please contact via [facility] [employer] or  Rehab Office at [phone]

## 2022-12-05 ENCOUNTER — Encounter: Payer: Self-pay | Admitting: Internal Medicine

## 2022-12-05 NOTE — Progress Notes (Signed)
TO BE COMPLETED BY RADIATION ONCOLOGIST OFFICE:   Patient Name: Cameron Huerta   Date of Birth: 1955/12/20   Radiation Oncologist: Dr. Tyler Pita   Site to be Treated: Prostate   Will x-rays >10 MV be used? No   Will the radiation be >10 cm from the device? Yes   Planned Treatment Start Date: 4 weeks   TO BE COMPLETED BY CARDIOLOGIST OFFICE:   Device Information:  Pacemaker [x]$      ICD []$    Brand: Biotronik: [phone] Model #: [id number] [place] 8 DR-T  Serial Number: [id number]   Date of Placement: 12/02/2022  Site of Placement: left upper chest  Remote Device Check--Frequency: scheduled for 91 days  Pt implanted on 12/07/2022-scheduled for first device check 12/13/2022  Is the Patient Pacer Dependent?:  Unknown.  First device check 12/13/2022  Does cardiologist request Radiation Oncology to schedule device testing by vendor for the following:  Prior to the Initiation of Treatments?  Pt needs wound check/device interrogation before therapy starts.  Scheduled for 12/13/2022. During Treatments?  Yes []$  No [x]$  Post Radiation Treatments?  Yes []$  No [x]$   Is device monitoring necessary by vendor/cardiologist team during treatments?  Yes []$   No [x]$   Is cardiac monitoring by Radiation Oncology nursing necessary during treatments? Yes [x]$   No []$   Do you recommend device be relocated prior to Radiation Treatment? Yes []$   No [x]$   **PLEASE LIST ANY NOTES OR SPECIAL REQUESTS:       CARDIOLOGIST SIGNATURE:  Dr. Cristopher Peru Per [facility] Standing Orders, Damian Leavell  12/05/2022 1:28 PM  **Please route completed form back to Radiation Oncology Nursing and "[place]", OR send an update if there will be a delay in having form completed by expected start date.  **Call [phone] if you have any questions or do not get an in-basket response from a Radiation Oncology staff member

## 2022-12-06 NOTE — Progress Notes (Signed)
Patient with consult on 1/15 for his stage T1c adenocarcinoma of the prostate with Gleason Score of 3+4, and PSA of 7.28.  Patient agreeable to 5.5 weeks of radiation, however, request to wait until May due to occupation.  Recommendations to recheck PSA to ensure we can safely delay radiation until May.  Patient had recheck of PSA on 2/19 (8.43)   Prior results from 12/20/21 (7.28).      Per PA, okay to delay radiation however recommends obtaining an additional PSA in May, to establish a current baseline and further evaluate the trend, prior to starting radiation treatments.   RN updated patient, verbalized understanding and agreement.  Will ensure PSA is rechecked prior to starting treatment.

## 2022-12-12 ENCOUNTER — Telehealth: Payer: Self-pay | Admitting: Internal Medicine

## 2022-12-12 ENCOUNTER — Telehealth: Payer: Self-pay | Admitting: *Deleted

## 2022-12-12 ENCOUNTER — Other Ambulatory Visit: Payer: Self-pay | Admitting: Urology

## 2022-12-12 NOTE — Telephone Encounter (Signed)
   Pre-operative Risk Assessment    Patient Name: Cameron Huerta  DOB: 1956-01-13 MRN: [id number]      Request for Surgical Clearance    Procedure:   Girtha Rm Seed markers placed   Date of Surgery:  Clearance 02/05/23                                 Surgeon:  Dr. Link Snuffer  Surgeon's Group or Practice Name:  [facility] Phone number:  [phone] Fax number:  [phone]   Type of Clearance Requested:   - Medical  - Pharmacy:  Hold Rivaroxaban (Xarelto) hold 72 hrs prior to surgery   Type of Anesthesia:  MAC   Additional requests/questions:      [name]   12/12/2022, 12:00 PM

## 2022-12-12 NOTE — Telephone Encounter (Signed)
Pt has been scheduled for tele pre op appt 01/13/23 @ 9 am. Med rec and consent are done.     Patient Consent for Virtual Visit        Cameron Huerta has provided verbal consent on 12/12/2022 for a virtual visit (video or telephone).   CONSENT FOR VIRTUAL VISIT FOR:  Cameron Huerta  By participating in this virtual visit I agree to the following:  I hereby voluntarily request, consent and authorize [facility] and its employed or contracted physicians, physician assistants, nurse practitioners or other licensed health care professionals (the Practitioner), to provide me with telemedicine health care services (the "Services") as deemed necessary by the treating Practitioner. I acknowledge and consent to receive the Services by the Practitioner via telemedicine. I understand that the telemedicine visit will involve communicating with the Practitioner through live audiovisual communication technology and the disclosure of certain medical information by electronic transmission. I acknowledge that I have been given the opportunity to request an in-person assessment or other available alternative prior to the telemedicine visit and am voluntarily participating in the telemedicine visit.  I understand that I have the right to withhold or withdraw my consent to the use of telemedicine in the course of my care at any time, without affecting my right to future care or treatment, and that the Practitioner or I may terminate the telemedicine visit at any time. I understand that I have the right to inspect all information obtained and/or recorded in the course of the telemedicine visit and may receive copies of available information for a reasonable fee.  I understand that some of the potential risks of receiving the Services via telemedicine include:  Delay or interruption in medical evaluation due to technological equipment failure or disruption; Information transmitted may not be sufficient (e.g. poor  resolution of images) to allow for appropriate medical decision making by the Practitioner; and/or  In rare instances, security protocols could fail, causing a breach of personal health information.  Furthermore, I acknowledge that it is my responsibility to provide information about my medical history, conditions and care that is complete and accurate to the best of my ability. I acknowledge that Practitioner's advice, recommendations, and/or decision may be based on factors not within their control, such as incomplete or inaccurate data provided by me or distortions of diagnostic images or specimens that may result from electronic transmissions. I understand that the practice of medicine is not an exact science and that Practitioner makes no warranties or guarantees regarding treatment outcomes. I acknowledge that a copy of this consent can be made available to me via my patient portal ([facility]), or I can request a printed copy by calling the office of [facility].    I understand that my insurance will be billed for this visit.   I have read or had this consent read to me. I understand the contents of this consent, which adequately explains the benefits and risks of the Services being provided via telemedicine.  I have been provided ample opportunity to ask questions regarding this consent and the Services and have had my questions answered to my satisfaction. I give my informed consent for the services to be provided through the use of telemedicine in my medical care

## 2022-12-12 NOTE — Telephone Encounter (Signed)
Pt has been scheduled for tele pre op appt 01/13/23 @ 9 am. Med rec and consent are done.

## 2022-12-12 NOTE — Telephone Encounter (Signed)
Patient with diagnosis of PE and A flutter on Xarelto for anticoagulation.    Procedure:  Gold Seed markers placed   Date of procedure: 02/05/23   CHA2DS2-VASc Score = 3  This indicates a 3.2% annual risk of stroke. The patient's score is based upon: CHF History: 0 HTN History: 1 Diabetes History: 0 Stroke History: 0 Vascular Disease History: 1 Age Score: 1 Gender Score: 0   CrCl 86 mL/min Platelet count 191K   Per office protocol, patient can hold Xarelto for 2 days prior to procedure.    **This guidance is not considered finalized until pre-operative APP has relayed final recommendations.**;

## 2022-12-12 NOTE — Telephone Encounter (Signed)
   Name: Cameron Huerta  DOB: [birth year]  MRN: [id number]  Primary Cardiologist: Virl Axe, MD  Chart reviewed as part of pre-operative protocol coverage. Because of Cameron Huerta's past medical history and time since last visit, he will require a follow-up telephone visit in order to better assess preoperative cardiovascular risk.  Pre-op covering staff: - Please schedule appointment and call patient to inform them. If patient already had an upcoming appointment within acceptable timeframe, please add "pre-op clearance" to the appointment notes so provider is aware. - Please contact requesting surgeon's office via preferred method (i.e, phone, fax) to inform them of need for appointment prior to surgery.  Per office protocol, patient can hold Xarelto for 2 days prior to procedure   Elgie Collard, PA-C  12/12/2022, 5:29 PM

## 2022-12-13 ENCOUNTER — Ambulatory Visit: Payer: Medicare PPO | Attending: Cardiovascular Disease

## 2022-12-13 DIAGNOSIS — I442 Atrioventricular block, complete: Secondary | ICD-10-CM

## 2022-12-13 LAB — CUP PACEART INCLINIC DEVICE CHECK
Brady Statistic RA Percent Paced: 60 %
Brady Statistic RV Percent Paced: 100 %
Date Time Interrogation Session: 20240301172207
Implantable Lead Connection Status: 753985
Implantable Lead Connection Status: 753985
Implantable Lead Implant Date: 20240219
Implantable Lead Implant Date: 20240219
Implantable Lead Location: 753859
Implantable Lead Location: 753860
Implantable Lead Model: 377
Implantable Lead Model: 377
Implantable Lead Serial Number: 8001238269
Implantable Lead Serial Number: 8001307092
Implantable Pulse Generator Implant Date: 20240219
Lead Channel Impedance Value: 526 Ohm
Lead Channel Impedance Value: 585 Ohm
Lead Channel Pacing Threshold Amplitude: 0.9 V
Lead Channel Pacing Threshold Amplitude: 0.9 V
Lead Channel Pacing Threshold Amplitude: 0.9 V
Lead Channel Pacing Threshold Amplitude: 10.3 V
Lead Channel Pacing Threshold Pulse Width: 0.4 ms
Lead Channel Pacing Threshold Pulse Width: 0.4 ms
Lead Channel Pacing Threshold Pulse Width: 0.4 ms
Lead Channel Sensing Intrinsic Amplitude: 10.3 mV
Lead Channel Sensing Intrinsic Amplitude: 7.9 mV
Pulse Gen Model: 407145
Pulse Gen Serial Number: 1000156359

## 2022-12-13 NOTE — Progress Notes (Signed)
Wound check appointment. Steri-strips removed. Wound without redness or edema. Incision edges approximated, wound well healed. Device interrogation performed by [place] with [employer]. (see full report scant to [facility]). Normal device function. Thresholds, sensing, and impedances consistent with implant measurements. Device programmed at 3.0V  for extra safety margin until 3 month visit. Histogram distribution appropriate for patient and level of activity. No mode switches or high ventricular rates noted. Patient educated about wound care, arm mobility, lifting restrictions. ROV in 3 months with implanting physician. No programming changes made. Due to error saving report - see printed report sent for scanning to [employer].

## 2022-12-13 NOTE — Patient Instructions (Signed)
   After Your Pacemaker   Monitor your pacemaker site for redness, swelling, and drainage. Call the device clinic at [phone] if you experience these symptoms or fever/chills.  Your incision was closed with Steri-strips or staples:  You may shower 7 days after your procedure and wash your incision with soap and water. Avoid lotions, ointments, or perfumes over your incision until it is well-healed.  You may use a hot tub or a pool after your wound check appointment if the incision is completely closed.  Do not lift, push or pull greater than 10 pounds with the affected arm until 6 weeks after your procedure. UNTIL AFTER APRIL 1ST. There are no other restrictions in arm movement after your wound check appointment.  You may drive, unless driving has been restricted by your healthcare providers..  Remote monitoring is used to monitor your pacemaker from home. This monitoring is scheduled every 91 days by our office. It allows [place] to keep an eye on the functioning of your device to ensure it is working properly. You will routinely see your Electrophysiologist annually (more often if necessary).

## 2022-12-17 ENCOUNTER — Ambulatory Visit: Payer: Medicare PPO | Admitting: Internal Medicine

## 2023-01-07 ENCOUNTER — Other Ambulatory Visit (HOSPITAL_COMMUNITY): Payer: Self-pay | Admitting: Urology

## 2023-01-07 DIAGNOSIS — C61 Malignant neoplasm of prostate: Secondary | ICD-10-CM

## 2023-01-13 ENCOUNTER — Ambulatory Visit: Payer: Medicare PPO | Attending: Internal Medicine

## 2023-01-13 DIAGNOSIS — Z01810 Encounter for preprocedural cardiovascular examination: Secondary | ICD-10-CM | POA: Diagnosis not present

## 2023-01-13 NOTE — Progress Notes (Signed)
Virtual Visit via Telephone Note   Because of Cameron Huerta's co-morbid illnesses, he is at least at moderate risk for complications without adequate follow up.  This format is felt to be most appropriate for this patient at this time.  The patient did not have access to video technology/had technical difficulties with video requiring transitioning to audio format only (telephone).  All issues noted in this document were discussed and addressed.  No physical exam could be performed with this format.  Please refer to the patient's chart for his consent to telehealth for [facility].  Evaluation Performed:  Preoperative cardiovascular risk assessment _____________   Date:  01/13/2023   Patient ID:  Cameron Huerta, DOB 1956/06/13, MRN [id number] Patient Location:  Home Provider location:   Office  Primary Care Provider:  Seward Carol, MD Primary Cardiologist:  Virl Axe, MD  Chief Complaint / Patient Profile   67 y.o. 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  History of Present Illness    Cameron Huerta is a 67 y.o. male who presents via audio/video conferencing for a telehealth visit today.  Pt was last seen in cardiology clinic on 03/14/2022 by Dr. Caryl Comes.  At that time Cameron Huerta was doing well  with no new cardiac complaints. He was recently seen in the hospital for near syncope due to  high grade AVB and underwent implant of PPM on 12/02/22. The patient is now pending procedure as outlined above. Since his last visit, he has been doing well with no new cardiac complaints since his procedure.  He denies chest pain, shortness of breath, lower extremity edema, fatigue, palpitations, melena, hematuria, hemoptysis, diaphoresis, weakness, presyncope, syncope, orthopnea, and PND.    Past Medical History    Past Medical History:   Diagnosis Date   First degree AV block    History of nuclear stress test    Abnormal   Pulmonary embolism ([place]) 10/2013   Syncope    after abrupt cessation of exercise   Typical atrial flutter (HCC)    Mali score - 0   Past Surgical History:  Procedure Laterality Date   COLONOSCOPY WITH PROPOFOL N/A 12/18/2018   Procedure: COLONOSCOPY WITH PROPOFOL;  Surgeon: Cameron Ada, MD;  Location: [facility] ENDOSCOPY;  Service: Endoscopy;  Laterality: N/A;   PACEMAKER IMPLANT N/A 12/02/2022   Procedure: PACEMAKER IMPLANT;  Surgeon: Evans Lance, MD;  Location: [facility] LAB;  Service: Cardiovascular;  Laterality: N/A;   POLYPECTOMY  12/18/2018   Procedure: POLYPECTOMY;  Surgeon: Cameron Ada, MD;  Location: [facility] ENDOSCOPY;  Service: Endoscopy;;    Allergies  Allergies  Allergen Reactions   Morphine And Related Anaphylaxis   Gluten Meal Other (See Comments)    Prefers not to eat it   Scallops [Shellfish Allergy] Itching and Nausea And Vomiting    GI upset, also  Sea Bass specifically    Shellfish-Derived Products Itching and Nausea And Vomiting    GI upset, also    Home Medications    Prior to Admission medications   Medication Sig Start Date End Date Taking? Authorizing Provider  ibuprofen (ADVIL,MOTRIN) 200 MG tablet Take 800 mg by mouth every 8 (eight) hours as needed (for pain.).    [provider]  losartan-hydrochlorothiazide (HYZAAR) 50-12.5 MG tablet Take 1 tablet by mouth daily. 12/19/21   [provider]  Multiple Vitamin (MULTIVITAMIN WITH MINERALS) TABS tablet Take 1 tablet by mouth in the morning and at bedtime.    [provider]  rivaroxaban (XARELTO) 20 MG TABS tablet Take 1 tablet (20 mg total) by mouth daily with supper. 05/08/15   Juanito Doom, MD  rosuvastatin (CRESTOR) 5 MG tablet Take 5 mg by mouth daily. 12/21/21   [provider]    Physical Exam    Vital Signs:  Cameron Huerta does not have vital signs available for review  today.  Given telephonic nature of communication, physical exam is limited. AAOx3. NAD. Normal affect.  Speech and respirations are unlabored.  Accessory Clinical Findings    None  Assessment & Plan    1.  Preoperative Cardiovascular Risk Assessment:   Cameron Huerta's perioperative risk of a major cardiac event is 0.9% according to the Revised Cardiac Risk Index (RCRI).  Therefore, he is at low risk for perioperative complications.   His functional capacity is good at 4.31 METs according to the Duke Activity Status Index (DASI). Recommendations: According to ACC/AHA guidelines, no further cardiovascular testing needed.  The patient may proceed to surgery at acceptable risk.   Antiplatelet and/or Anticoagulation Recommendations: Xarelto (Rivaroxaban) can be held for 2 days prior to surgery.  Please resume post op when felt to be safe.      The patient was advised that if he develops new symptoms prior to surgery to contact our office to arrange for a follow-up visit, and he verbalized understanding.  A copy of this note will be routed to requesting surgeon.  Time:   Today, I have spent 6 minutes with the patient with telehealth technology discussing medical history, symptoms, and management plan.     Mable Fill, Marissa Nestle, NP  01/13/2023, 7:16 AM

## 2023-01-14 ENCOUNTER — Encounter: Payer: Self-pay | Admitting: Internal Medicine

## 2023-01-14 ENCOUNTER — Encounter (HOSPITAL_COMMUNITY): Payer: Self-pay

## 2023-01-14 NOTE — Patient Instructions (Signed)
SURGICAL WAITING ROOM VISITATION  Patients having surgery or a procedure may have no more than 2 support people in the waiting area - these visitors may rotate.    Children under the age of 55 must have an adult with them who is not the patient.  Due to an increase in RSV and influenza rates and associated hospitalizations, children ages 90 and under may not visit patients in [place].  If the patient needs to stay at the hospital during part of their recovery, the visitor guidelines for inpatient rooms apply. Pre-op nurse will coordinate an appropriate time for 1 support person to accompany patient in pre-op.  This support person may not rotate.    Please refer to the [facility] website for the visitor guidelines for Inpatients (after your surgery is over and you are in a regular room).       Your procedure is scheduled on: 02-05-23   Report to [facility] Main Entrance    Report to admitting at      [place]   AM   Call this number if you have problems the morning of surgery [phone]   Do not eat food  OR DRINK LIQUIDS :After Midnight.               If you have questions, please contact your surgeon's office.   FOLLOW BOWEL PREP AND ANY ADDITIONAL PRE OP INSTRUCTIONS YOU RECEIVED FROM YOUR SURGEON'S OFFICE!!              ONE FLEETS ENEMA THE NIGHT BEFORE YOUR SURGERY   Oral Hygiene is also important to reduce your risk of infection.                                    Remember - BRUSH YOUR TEETH THE MORNING OF SURGERY WITH YOUR REGULAR TOOTHPASTE  DENTURES WILL BE REMOVED PRIOR TO SURGERY PLEASE DO NOT APPLY "Poly grip" OR ADHESIVES!!!   Do NOT smoke after Midnight   Take these medicines the morning of surgery with A SIP OF WATER: rosuvastatin  DO NOT TAKE ANY ORAL DIABETIC MEDICATIONS DAY OF YOUR SURGERY  Bring CPAP mask and tubing day of surgery.                              You may not have any metal on your body including hair pins, jewelry,  and body piercing             Do not wear  lotions, powders, perfumes/cologne, or deodorant               Men may shave face and neck.   Do not bring valuables to the hospital. [place].   Contacts, glasses, dentures or bridgework may not be worn into surgery.   Bring small overnight bag day of surgery.   DO NOT [place]. PHARMACY WILL DISPENSE MEDICATIONS LISTED ON YOUR MEDICATION LIST TO YOU DURING YOUR ADMISSION [place]!    Patients discharged on the day of surgery will not be allowed to drive home.  Someone NEEDS to stay with you for the first 24 hours after anesthesia.   Special Instructions: Bring a copy of your healthcare power of  attorney and living will documents the day of surgery if you haven't scanned them before.              Please read over the following fact sheets you were given: IF [place] [phone]   If you received a COVID test during your pre-op visit  it is requested that you wear a mask when out in public, stay away from anyone that may not be feeling well and notify your surgeon if you develop symptoms. If you test positive for Covid or have been in contact with anyone that has tested positive in the last 10 days please notify you surgeon.    [facility] - Preparing for Surgery Before surgery, you can play an important role.  Because skin is not sterile, your skin needs to be as free of germs as possible.  You can reduce the number of germs on your skin by washing with CHG (chlorahexidine gluconate) soap before surgery.  CHG is an antiseptic cleaner which kills germs and bonds with the skin to continue killing germs even after washing. Please DO NOT use if you have an allergy to CHG or antibacterial soaps.  If your skin becomes reddened/irritated stop using the CHG and inform your nurse when you arrive at [facility]  Stay. Do not shave (including legs and underarms) for at least 48 hours prior to the first CHG shower.  You may shave your face/neck. Please follow these instructions carefully:  1.  Shower with CHG Soap the night before surgery and the  morning of Surgery.  2.  If you choose to wash your hair, wash your hair first as usual with your  normal  shampoo.  3.  After you shampoo, rinse your hair and body thoroughly to remove the  shampoo.                           4.  Use CHG as you would any other liquid soap.  You can apply chg directly  to the skin and wash                       Gently with a scrungie or clean washcloth.  5.  Apply the CHG Soap to your body ONLY FROM THE NECK DOWN.   Do not use on face/ open                           Wound or open sores. Avoid contact with eyes, ears mouth and genitals (private parts).                       Wash face,  Genitals (private parts) with your normal soap.             6.  Wash thoroughly, paying special attention to the area where your surgery  will be performed.  7.  Thoroughly rinse your body with warm water from the neck down.  8.  DO NOT shower/wash with your normal soap after using and rinsing off  the CHG Soap.                9.  Pat yourself dry with a clean towel.            10.  Wear clean pajamas.            11.  Place clean sheets on your bed the night of your first shower and do not  sleep with pets. Day of Surgery : Do not apply any lotions/deodorants the morning of surgery.  Please wear clean clothes to the hospital/surgery center.  FAILURE TO FOLLOW THESE INSTRUCTIONS MAY RESULT IN THE CANCELLATION OF YOUR SURGERY PATIENT SIGNATURE_________________________________  NURSE SIGNATURE__________________________________  ________________________________________________________________________

## 2023-01-14 NOTE — Progress Notes (Signed)
[place] DEVICE PROGRAMMING  Patient Information: Name:  YORK HARCUM  DOB:  [birth year]  MRN:  [id number]    Planned Procedure: Place judicial markers and space oar  Surgeon: Link Snuffer  Date of Procedure:  02-05-23  Cautery will be used.  Position during surgery:  N/A   Please send documentation back to:  [facility] (Fax # [phone])  Device Information:  Clinic EP Physician:  Cristopher Peru, MD   Device Type:  Pacemaker Manufacturer and Phone #:  [employer]: [phone] Pacemaker Dependent?:  Yes.   Date of Last Device Check:  12/13/2022 Normal Device Function?:  Yes.    Electrophysiologist's Recommendations:  Have magnet available. Provide continuous ECG monitoring when magnet is used or reprogramming is to be performed.  Procedure should not interfere with device function.  No device programming or magnet placement needed.  Per Device Clinic Standing Orders, Simone Curia, RN  10:59 AM 01/14/2023

## 2023-01-14 NOTE — Progress Notes (Addendum)
PCP - Renford Dills ,MD Cardiologist - Sherryl Manges, MD clearance 01-13-23 Robin Searing, NP epic  PPM/ICD - PPM Device Orders - on chart Rep Notified - no Last device check-12-13-22 epic  Chest x-ray - 12-03-22 epic EKG - 12-03-22 epic Stress Test -  ECHO - 11-29-22 epic Cardiac Cath -   Sleep Study -  CPAP -   Fasting Blood Sugar -  Checks Blood Sugar _____ times a day  Blood Thinner Instructions:Xarelto hold 2 days Aspirin Instructions:  ERAS Protcol - PRE-SURGERY Ensure or G2-    COVID vaccine -x5  Activity--Able to climb a flight of stairs without SOB or Cp Anesthesia review: Pacemaker, complete heart block, a flutter, PE, 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 Patient agrees to go over the instructions while at home for a better understanding. Patient also instructed to self quarantine after being tested for COVID-19. The opportunity to ask questions was provided.

## 2023-01-20 NOTE — Progress Notes (Signed)
RN left voicemail @ [facility] for call back.

## 2023-01-21 ENCOUNTER — Encounter (HOSPITAL_COMMUNITY)
Admission: RE | Admit: 2023-01-21 | Discharge: 2023-01-21 | Disposition: A | Discharge status: Home / Self Care | Payer: Medicare PPO | Source: Ambulatory Visit | Attending: Urology | Admitting: Urology

## 2023-01-21 ENCOUNTER — Encounter (HOSPITAL_COMMUNITY): Payer: Self-pay

## 2023-01-21 ENCOUNTER — Other Ambulatory Visit: Payer: Self-pay

## 2023-01-21 VITALS — BP 125/82 | HR 74 | Temp 98.5°F | Resp 18 | Ht 74.0 in | Wt 226.0 lb

## 2023-01-21 DIAGNOSIS — I442 Atrioventricular block, complete: Secondary | ICD-10-CM | POA: Diagnosis not present

## 2023-01-21 DIAGNOSIS — Z01812 Encounter for preprocedural laboratory examination: Secondary | ICD-10-CM | POA: Diagnosis not present

## 2023-01-21 HISTORY — DX: Congenital malformation of aorta unspecified: Q25.40

## 2023-01-21 HISTORY — DX: Malignant (primary) neoplasm, unspecified: C80.1

## 2023-01-21 HISTORY — DX: Essential (primary) hypertension: I10

## 2023-01-21 HISTORY — DX: Presence of cardiac pacemaker: Z95.0

## 2023-01-21 HISTORY — DX: Atrioventricular block, complete: I44.2

## 2023-01-21 LAB — CBC
HCT: 43 % (ref 39.0–52.0)
Hemoglobin: 14.2 g/dL (ref 13.0–17.0)
MCH: 29.9 pg (ref 26.0–34.0)
MCHC: 33 g/dL (ref 30.0–36.0)
MCV: 90.5 fL (ref 80.0–100.0)
Platelets: 227 10*3/uL (ref 150–400)
RBC: 4.75 MIL/uL (ref 4.22–5.81)
RDW: 13.6 % (ref 11.5–15.5)
WBC: 3 10*3/uL — ABNORMAL LOW (ref 4.0–10.5)
nRBC: 0 % (ref 0.0–0.2)

## 2023-01-21 LAB — BASIC METABOLIC PANEL
Anion gap: 9 (ref 5–15)
BUN: 20 mg/dL (ref 8–23)
CO2: 26 mmol/L (ref 22–32)
Calcium: 9 mg/dL (ref 8.9–10.3)
Chloride: 101 mmol/L (ref 98–111)
Creatinine, Ser: 1.11 mg/dL (ref 0.61–1.24)
GFR, Estimated: >60 mL/min (ref >60)
Glucose, Bld: 87 mg/dL (ref 70–99)
Potassium: 3.9 mmol/L (ref 3.5–5.1)
Sodium: 136 mmol/L (ref 135–145)

## 2023-01-23 NOTE — Anesthesia Preprocedure Evaluation (Addendum)
Anesthesia Evaluation  Patient identified by MRN, date of birth, ID band Patient awake    Reviewed: Allergy & Precautions, NPO status , Patient's Chart, lab work & pertinent test results  Airway Mallampati: I  TM Distance: >3 FB Neck ROM: Full    Dental  (+) Dental Advisory Given   Pulmonary neg pulmonary ROS   breath sounds clear to auscultation       Cardiovascular hypertension, Pt. on medications + dysrhythmias + pacemaker  Rhythm:Regular Rate:Normal     Neuro/Psych negative neurological ROS     GI/Hepatic negative GI ROS, Neg liver ROS,,,  Endo/Other  negative endocrine ROS    Renal/GU negative Renal ROS     Musculoskeletal   Abdominal   Peds  Hematology negative hematology ROS (+)   Anesthesia Other Findings   Reproductive/Obstetrics                             Anesthesia Physical Anesthesia Plan  ASA: 3  Anesthesia Plan: MAC   Post-op Pain Management: Tylenol PO (pre-op)*   Induction:   PONV Risk Score and Plan: 1 and Propofol infusion, Ondansetron and Treatment may vary due to age or medical condition  Airway Management Planned: Natural Airway and Simple Face Mask  Additional Equipment:   Intra-op Plan:   Post-operative Plan:   Informed Consent: I have reviewed the patients History and Physical, chart, labs and discussed the procedure including the risks, benefits and alternatives for the proposed anesthesia with the patient or authorized representative who has indicated his/her understanding and acceptance.       Plan Discussed with: CRNA  Anesthesia Plan Comments:        Anesthesia Quick Evaluation

## 2023-01-23 NOTE — Progress Notes (Signed)
Anesthesia chart review  Case: [id number] Date/Time: 02/05/23 1115   Procedures:      GOLD SEED IMPLANT - 30 MINS     SPACE OAR INSTILLATION   Anesthesia type: Monitor Anesthesia Care   Pre-op diagnosis: PROSTATE CANCER   Location: [facility]   Surgeons: Crista Elliot, MD       DISCUSSION: 67 year old never smoker with a history of hypertension, PE, atrial flutter, CHB s/p PPM on 11/2022 (device order send progress note 01/14/2023), prostate cancer scheduled for above procedure 02/05/2023 with Dr. Modena Slater.  Patient last seen by cardiology 01/13/2023.  Per office visit note, "Mr. Fuchs's perioperative risk of a major cardiac event is 0.9% according to the Revised Cardiac Risk Index (RCRI).  Therefore, he is at low risk for perioperative complications.   His functional capacity is good at 4.31 METs according to the Duke Activity Status Index (DASI). Recommendations: According to ACC/AHA guidelines, no further cardiovascular testing needed.  The patient may proceed to surgery at acceptable risk.   Antiplatelet and/or Anticoagulation Recommendations: Xarelto (Rivaroxaban) can be held for 2 days prior to surgery.  Please resume post op when felt to be safe."  Anticipate pt can proceed with planned procedure barring acute status change.   VS: There were no vitals taken for this visit.  PROVIDERS: Renford Dills, MD his PCP  Cardiologist - Sherryl Manges, MD  LABS: Labs reviewed: Acceptable for surgery. (all labs ordered are listed, but only abnormal results are displayed)  Labs Reviewed - No data to display   IMAGES:   EKG:   CV: Echo 11/29/2022 1. Left ventricular ejection fraction, by estimation, is 60 to 65%. The  left ventricle has normal function. The left ventricle has no regional  wall motion abnormalities. Left ventricular diastolic parameters were  normal.   2. Right ventricular systolic function is normal. The right ventricular  size is normal. There  is normal pulmonary artery systolic pressure.   3. Left atrial size was mildly dilated.   4. The mitral valve is normal in structure. No evidence of mitral valve  regurgitation. No evidence of mitral stenosis.   5. The aortic valve is tricuspid. Aortic valve regurgitation is mild to  moderate. No aortic stenosis is present.   6. The inferior vena cava is normal in size with greater than 50%  respiratory variability, suggesting right atrial pressure of 3 mmHg.  Past Medical History:  Diagnosis Date   Abnormality of thoracic aorta    mildly dialated 3.7 cm   Cancer    prostate   Complete heart block    First degree AV block    History of nuclear stress test    Abnormal   Hypertension    Presence of permanent cardiac pacemaker    Pulmonary embolism 10/2013   Syncope    after abrupt cessation of exercise   Typical atrial flutter    [place] score - 0    Past Surgical History:  Procedure Laterality Date   COLONOSCOPY WITH PROPOFOL N/A 12/18/2018   Procedure: COLONOSCOPY WITH PROPOFOL;  Surgeon: Jeani Hawking, MD;  [facility];  Service: Endoscopy;  Laterality: N/A;   PACEMAKER IMPLANT N/A 12/02/2022   Procedure: PACEMAKER IMPLANT;  Surgeon: Marinus Maw, MD;  Location: [facility] INVASIVE CV LAB;  Service: Cardiovascular;  Laterality: N/A;   POLYPECTOMY  12/18/2018   Procedure: POLYPECTOMY;  Surgeon: Jeani Hawking, MD;  [facility];  Service: Endoscopy;;    MEDICATIONS: No current  facility-administered medications for this encounter.    losartan-hydrochlorothiazide (HYZAAR) 50-12.5 MG tablet   Multiple Vitamin (MULTIVITAMIN WITH MINERALS) TABS tablet   Probiotic Product (PROBIOTIC PO)   rivaroxaban (XARELTO) 20 MG TABS tablet   rosuvastatin (CRESTOR) 5 MG tablet     [facility], PA-C [facility] Pre-Surgical Testing [phone]

## 2023-01-27 DIAGNOSIS — N401 Enlarged prostate with lower urinary tract symptoms: Secondary | ICD-10-CM | POA: Diagnosis not present

## 2023-01-27 DIAGNOSIS — C61 Malignant neoplasm of prostate: Secondary | ICD-10-CM | POA: Diagnosis not present

## 2023-02-05 ENCOUNTER — Encounter (HOSPITAL_COMMUNITY)
Admission: RE | Disposition: A | Discharge status: Home / Self Care | Payer: Self-pay | Source: Ambulatory Visit | Attending: Urology

## 2023-02-05 ENCOUNTER — Encounter (HOSPITAL_COMMUNITY): Payer: Self-pay | Admitting: Urology

## 2023-02-05 ENCOUNTER — Ambulatory Visit (HOSPITAL_COMMUNITY)
Admission: RE | Admit: 2023-02-05 | Discharge: 2023-02-05 | Disposition: A | Discharge status: Home / Self Care | Payer: Medicare PPO | Source: Ambulatory Visit | Attending: Urology | Admitting: Urology

## 2023-02-05 ENCOUNTER — Ambulatory Visit (HOSPITAL_COMMUNITY): Payer: Medicare PPO | Admitting: Physician Assistant

## 2023-02-05 ENCOUNTER — Ambulatory Visit (HOSPITAL_COMMUNITY): Payer: Medicare PPO

## 2023-02-05 ENCOUNTER — Telehealth: Payer: Self-pay | Admitting: *Deleted

## 2023-02-05 ENCOUNTER — Other Ambulatory Visit: Payer: Self-pay

## 2023-02-05 ENCOUNTER — Encounter (HOSPITAL_COMMUNITY): Payer: Self-pay

## 2023-02-05 DIAGNOSIS — Z950 Presence of cardiac pacemaker: Secondary | ICD-10-CM | POA: Insufficient documentation

## 2023-02-05 DIAGNOSIS — C61 Malignant neoplasm of prostate: Secondary | ICD-10-CM | POA: Insufficient documentation

## 2023-02-05 DIAGNOSIS — I10 Essential (primary) hypertension: Secondary | ICD-10-CM | POA: Diagnosis not present

## 2023-02-05 DIAGNOSIS — I499 Cardiac arrhythmia, unspecified: Secondary | ICD-10-CM | POA: Diagnosis not present

## 2023-02-05 HISTORY — PX: GOLD SEED IMPLANT: SHX6343

## 2023-02-05 HISTORY — PX: SPACE OAR INSTILLATION: SHX6769

## 2023-02-05 SURGERY — INSERTION, GOLD SEEDS
Anesthesia: Monitor Anesthesia Care

## 2023-02-05 MED ORDER — LACTATED RINGERS IV SOLN: INTRAVENOUS | Status: DC

## 2023-02-05 MED ORDER — SODIUM CHLORIDE (PF) 0.9 % IJ SOLN
INTRAMUSCULAR | Status: AC
  Filled 2023-02-05: qty 10

## 2023-02-05 MED ORDER — LIDOCAINE HCL URETHRAL/MUCOSAL 2 % EX GEL
CUTANEOUS | Status: AC
  Filled 2023-02-05: qty 30

## 2023-02-05 MED ORDER — CEFAZOLIN SODIUM-DEXTROSE 2-4 GM/100ML-% IV SOLN
2.0000 g | Freq: Once | INTRAVENOUS | Status: AC
  Administered 2023-02-05: 2 g via INTRAVENOUS

## 2023-02-05 MED ORDER — MIDAZOLAM HCL 2 MG/2ML IJ SOLN
INTRAMUSCULAR | Status: AC
  Filled 2023-02-05: qty 2

## 2023-02-05 MED ORDER — FENTANYL CITRATE (PF) 100 MCG/2ML IJ SOLN
INTRAMUSCULAR | Status: DC | PRN
  Administered 2023-02-05: 50 ug via INTRAVENOUS
  Administered 2023-02-05 (×2): 25 ug via INTRAVENOUS

## 2023-02-05 MED ORDER — SODIUM CHLORIDE FLUSH 0.9 % IV SOLN
INTRAVENOUS | Status: DC | PRN
  Administered 2023-02-05: 10 mL

## 2023-02-05 MED ORDER — LIDOCAINE HCL (PF) 2 % IJ SOLN
INTRAMUSCULAR | Status: AC
  Filled 2023-02-05: qty 5

## 2023-02-05 MED ORDER — DEXAMETHASONE SODIUM PHOSPHATE 10 MG/ML IJ SOLN
INTRAMUSCULAR | Status: AC
  Filled 2023-02-05: qty 1

## 2023-02-05 MED ORDER — FENTANYL CITRATE (PF) 100 MCG/2ML IJ SOLN
INTRAMUSCULAR | Status: AC
  Filled 2023-02-05: qty 2

## 2023-02-05 MED ORDER — PROPOFOL 500 MG/50ML IV EMUL
INTRAVENOUS | Status: DC | PRN
  Administered 2023-02-05: 110 ug/kg/min via INTRAVENOUS

## 2023-02-05 MED ORDER — PROPOFOL 10 MG/ML IV BOLUS
INTRAVENOUS | Status: AC
  Filled 2023-02-05: qty 20

## 2023-02-05 MED ORDER — LIDOCAINE HCL (CARDIAC) PF 100 MG/5ML IV SOSY
PREFILLED_SYRINGE | INTRAVENOUS | Status: DC | PRN
  Administered 2023-02-05: 50 mg via INTRAVENOUS

## 2023-02-05 MED ORDER — ORAL CARE MOUTH RINSE: 15.0000 mL | Freq: Once | OROMUCOSAL | Status: AC

## 2023-02-05 MED ORDER — CHLORHEXIDINE GLUCONATE 0.12 % MT SOLN
15.0000 mL | Freq: Once | OROMUCOSAL | Status: AC
  Administered 2023-02-05: 15 mL via OROMUCOSAL

## 2023-02-05 MED ORDER — FLEET ENEMA 7-19 GM/118ML RE ENEM: 1.0000 | ENEMA | Freq: Once | RECTAL | Status: DC

## 2023-02-05 MED ORDER — ACETAMINOPHEN 500 MG PO TABS
1000.0000 mg | ORAL_TABLET | Freq: Once | ORAL | Status: AC
  Administered 2023-02-05: 1000 mg via ORAL
  Filled 2023-02-05: qty 2

## 2023-02-05 MED ORDER — HYDROCODONE-ACETAMINOPHEN 5-325 MG PO TABS: 1.0000 | ORAL_TABLET | ORAL | 0 refills | Status: DC | PRN

## 2023-02-05 MED ORDER — ONDANSETRON HCL 4 MG/2ML IJ SOLN
INTRAMUSCULAR | Status: DC | PRN
  Administered 2023-02-05: 4 mg via INTRAVENOUS

## 2023-02-05 MED ORDER — FENTANYL CITRATE PF 50 MCG/ML IJ SOSY: 25.0000 ug | PREFILLED_SYRINGE | INTRAMUSCULAR | Status: DC | PRN

## 2023-02-05 MED ORDER — PROPOFOL 10 MG/ML IV BOLUS
INTRAVENOUS | Status: DC | PRN
  Administered 2023-02-05 (×2): 10 mg via INTRAVENOUS

## 2023-02-05 MED ORDER — AMISULPRIDE (ANTIEMETIC) 5 MG/2ML IV SOLN: 10.0000 mg | Freq: Once | INTRAVENOUS | Status: DC | PRN

## 2023-02-05 MED ORDER — ONDANSETRON HCL 4 MG/2ML IJ SOLN
INTRAMUSCULAR | Status: AC
  Filled 2023-02-05: qty 2

## 2023-02-05 MED ORDER — CEFAZOLIN SODIUM-DEXTROSE 2-4 GM/100ML-% IV SOLN
INTRAVENOUS | Status: AC
  Filled 2023-02-05: qty 100

## 2023-02-05 MED ORDER — MIDAZOLAM HCL 5 MG/5ML IJ SOLN
INTRAMUSCULAR | Status: DC | PRN
  Administered 2023-02-05 (×2): 1 mg via INTRAVENOUS

## 2023-02-05 MED ORDER — SODIUM CHLORIDE (PF) 0.9 % IJ SOLN
INTRAMUSCULAR | Status: AC
  Filled 2023-02-05: qty 50

## 2023-02-05 SURGICAL SUPPLY — 24 items
BAG COUNTER SPONGE SURGICOUNT (BAG) IMPLANT
BAG SPNG CNTER NS LX DISP (BAG)
COVER SURGICAL LIGHT HANDLE (MISCELLANEOUS) ×1 IMPLANT
DRSG TEGADERM 4X4.75 (GAUZE/BANDAGES/DRESSINGS) ×2 IMPLANT
DRSG TEGADERM 8X12 (GAUZE/BANDAGES/DRESSINGS) ×2 IMPLANT
DRSG TELFA 3X8 NADH STRL (GAUZE/BANDAGES/DRESSINGS) ×1 IMPLANT
GLOVE BIO SURGEON STRL SZ7.5 (GLOVE) ×1 IMPLANT
GLOVE ECLIPSE 8.0 STRL XLNG CF (GLOVE) ×1 IMPLANT
IMPL SPACEOAR VUE SYSTEM (Spacer) ×1 IMPLANT
IMPLANT SPACEOAR VUE SYSTEM (Spacer) ×1 IMPLANT
KIT TURNOVER KIT A (KITS) IMPLANT
MARKER GOLD PRELOAD 1.2X3 (Urological Implant) ×1 IMPLANT
NDL SAFETY ECLIP 18X1.5 (MISCELLANEOUS) IMPLANT
NDL SPNL 22GX7 QUINCKE BK (NEEDLE) IMPLANT
NEEDLE SPNL 22GX7 QUINCKE BK (NEEDLE) IMPLANT
PACK CYSTO (CUSTOM PROCEDURE TRAY) ×1 IMPLANT
PENCIL SMOKE EVACUATOR (MISCELLANEOUS) IMPLANT
SEED GOLD PRELOAD 1.2X3 (Urological Implant) ×1 IMPLANT
SURGILUBE 2OZ TUBE FLIPTOP (MISCELLANEOUS) ×1 IMPLANT
SYR 20ML LL LF (SYRINGE) IMPLANT
TOWEL OR 17X26 10 PK STRL BLUE (TOWEL DISPOSABLE) ×1 IMPLANT
TUBING CONNECTING 10 (TUBING) IMPLANT
UNDERPAD 30X36 HEAVY ABSORB (UNDERPADS AND DIAPERS) ×2 IMPLANT
WATER STERILE IRR 500ML POUR (IV SOLUTION) ×1 IMPLANT

## 2023-02-05 NOTE — Telephone Encounter (Signed)
Called patient to remind of sim appt. for 02-07-23- arrival time- 12:45 pm @ [facility], informed patient to arrive  with a full bladder, lvm for a return call

## 2023-02-05 NOTE — Op Note (Signed)
Preoperative diagnosis: Clinically localized adenocarcinoma of the prostate  Postoperative diagnosis: Clinically localized adenocarcinoma of the prostate  Procedure: 1) Placement of fiducial markers into prostate                    2) Insertion of SpaceOAR hydrogel   Surgeon: Modena Slater, M.D.  Anesthesia: MAC sedation  EBL: Minimal  Complications: None  Indication: Cameron Huerta is a 67 y.o. gentleman with clinically localized prostate cancer. After discussing management options for treatment, he elected to proceed with radiotherapy. He presents today for the above procedures. The potential risks, complications, alternative options, and expected recovery course have been discussed in detail with the patient and he has provided informed consent to proceed.  Description of procedure: The patient was administered preoperative antibiotics, placed in the dorsal lithotomy position, and prepped and draped in the usual sterile fashion. Next, transrectal ultrasonography was utilized to visualize the prostate.  The perineum was anesthetized with quarter percent Marcaine. Three gold fiducial markers were then placed into the prostate via transperineal needles under ultrasound guidance at the right apex, right base, and left mid gland under direct ultrasound guidance. A site in the midline was then selected on the perineum for placement of an 18 g needle with saline. The needle was advanced above the rectum and below Denonvillier's fascia to the mid gland and confirmed to be in the midline on transverse imaging. One cc of saline was injected confirming appropriate expansion of this space. A total of 5 cc of saline was then injected to open the space further bilaterally. The saline syringe was then removed and the SpaceOAR hydrogel was injected with good distribution bilaterally. He tolerated the procedure well and without complications. He was given a voiding trial prior to discharge from the PACU.

## 2023-02-05 NOTE — Transfer of Care (Signed)
Immediate Anesthesia Transfer of Care Note  Patient: Cameron Huerta [facility]  Procedure(s) Performed: GOLD SEED IMPLANT SPACE OAR INSTILLATION  Patient Location: PACU  Anesthesia Type:MAC  Level of Consciousness: awake, alert , oriented, and patient cooperative  Airway & Oxygen Therapy: Patient Spontanous Breathing and Patient connected to face mask oxygen  Post-op Assessment: Report given to RN and Post -op Vital signs reviewed and stable  Post vital signs: Reviewed and stable  Last Vitals:  Vitals Value Taken Time  BP 109/79 02/05/23 1229  Temp    Pulse 69 02/05/23 1230  Resp 14 02/05/23 1230  SpO2 100 % 02/05/23 1230  Vitals shown include unvalidated device data.  Last Pain:  Vitals:   02/05/23 0931  TempSrc:   PainSc: 0-No pain         Complications: No notable events documented.

## 2023-02-05 NOTE — H&P (Signed)
H&P   Chief Complaint: Prostate cancer  History of Present Illness: 67 year old male with prostate cancer presents for gold seed marker and SpaceOAR.  Past Medical History:  Diagnosis Date   Abnormality of thoracic aorta    mildly dialated 3.7 cm   Cancer    prostate   Complete heart block    First degree AV block    History of nuclear stress test    Abnormal   Hypertension    Presence of permanent cardiac pacemaker    Pulmonary embolism 10/2013   Syncope    after abrupt cessation of exercise   Typical atrial flutter    [place] score - 0   Past Surgical History:  Procedure Laterality Date   COLONOSCOPY WITH PROPOFOL N/A 12/18/2018   Procedure: COLONOSCOPY WITH PROPOFOL;  Surgeon: Jeani Hawking, MD;  [facility];  Service: Endoscopy;  Laterality: N/A;   PACEMAKER IMPLANT N/A 12/02/2022   Procedure: PACEMAKER IMPLANT;  Surgeon: Marinus Maw, MD;  Location: [facility] INVASIVE CV LAB;  Service: Cardiovascular;  Laterality: N/A;   POLYPECTOMY  12/18/2018   Procedure: POLYPECTOMY;  Surgeon: Jeani Hawking, MD;  [facility];  Service: Endoscopy;;    Home Medications:  Medications Prior to Admission  Medication Sig Dispense Refill Last Dose   losartan-hydrochlorothiazide (HYZAAR) 50-12.5 MG tablet Take 1 tablet by mouth daily.   02/04/2023   Multiple Vitamin (MULTIVITAMIN WITH MINERALS) TABS tablet Take 1 tablet by mouth in the morning and at bedtime.   Past Week   Probiotic Product (PROBIOTIC PO) Take 1 capsule by mouth 2 (two) times daily.   Past Week   rivaroxaban (XARELTO) 20 MG TABS tablet Take 1 tablet (20 mg total) by mouth daily with supper. 30 tablet 0 02/03/2023   rosuvastatin (CRESTOR) 5 MG tablet Take 5 mg by mouth daily.   02/05/2023 at 0800   Allergies:  Allergies  Allergen Reactions   Morphine And Related Anaphylaxis   Gluten Meal Other (See Comments)    Prefers not to eat it   Scallops [Shellfish Allergy] Itching and Nausea And Vomiting    GI upset,  also  Sea Bass specifically     Family History  Problem Relation Age of Onset   Hypertension Father    Heart attack Father    Social History:  reports that he has never smoked. He has never used smokeless tobacco. He reports that he does not drink alcohol and does not use drugs.  ROS: A complete review of systems was performed.  All systems are negative except for pertinent findings as noted. ROS   Physical Exam:  Vital signs in last 24 hours: Temp:  [98.2 F (36.8 C)] 98.2 F (36.8 C) (04/24 0913) Pulse Rate:  [77] 77 (04/24 0913) Resp:  [18] 18 (04/24 0913) BP: (141)/(89) 141/89 (04/24 0913) SpO2:  [100 %] 100 % (04/24 0913) Weight:  [102.1 kg] 102.1 kg (04/24 0931) General:  Alert and oriented, No acute distress HEENT: Normocephalic, atraumatic Neck: No JVD or lymphadenopathy Cardiovascular: Regular rate and rhythm Lungs: Regular rate and effort Abdomen: Soft, nontender, nondistended, no abdominal masses Back: No CVA tenderness Extremities: No edema Neurologic: Grossly intact  Laboratory Data:  No results found for this or any previous visit (from the past 24 hour(s)). No results found for this or any previous visit (from the past 240 hour(s)). Creatinine: No results for input(s): "CREATININE" in the last 168 hours.  Impression/Assessment:  Prostate cancer  Plan:  Proceed with marker placement and SpaceOAR.  Risk and benefits discussed.  Cameron Huerta, Cameron Huerta 02/05/2023, 10:06 AM

## 2023-02-05 NOTE — Anesthesia Postprocedure Evaluation (Signed)
Anesthesia Post Note  Patient: Cameron Huerta  Procedure(s) Performed: GOLD SEED IMPLANT SPACE OAR INSTILLATION     Patient location during evaluation: PACU Anesthesia Type: MAC Level of consciousness: awake and alert Pain management: pain level controlled Vital Signs Assessment: post-procedure vital signs reviewed and stable Respiratory status: spontaneous breathing, nonlabored ventilation, respiratory function stable and patient connected to nasal cannula oxygen Cardiovascular status: stable and blood pressure returned to baseline Postop Assessment: no apparent nausea or vomiting Anesthetic complications: no  No notable events documented.  Last Vitals:  Vitals:   02/05/23 1245 02/05/23 1300  BP: 113/81 118/86  Pulse: 65 65  Resp: 15 13  Temp:  (!) 36.4 C  SpO2: 98% 100%    Last Pain:  Vitals:   02/05/23 1300  TempSrc:   PainSc: 0-No pain                 Kennieth Rad

## 2023-02-05 NOTE — Discharge Instructions (Signed)
Diet Resume your usual diet when you return home. To keep your bowels moving easily and softly, drink prune, apple and cranberry juice at room temperature. You may also take a stool softener, such as Colace, which is available without prescription at local pharmacies. Daily activities No driving or heavy lifting for at least two days after the implant. No bike riding, horseback riding or riding lawn mowers for the first month after the implant. Any strenuous physical activity should be approved by your doctor before you resume it. Sexual relations You may resume sexual relations two weeks after the procedure. Your semen may be dark brown or black; this is normal and is related bleeding that may have occurred during the implant. Postoperative swelling Expect swelling and bruising of the scrotum and perineum (the area between the scrotum and anus). Both the swelling and the bruising should resolve in l or 2 weeks. Ice packs and over- the-counter medications such as Tylenol, Advil or Aleve may lessen your discomfort. Postoperative urination Most men experience burning on urination and/or urinary frequency. If this becomes bothersome, contact your Urologist.  Medication can be prescribed to relieve these problems.  It is normal to have some blood in your urine for a few days after the implant.  Contact your doctor for Temperature greater than 101 F Increasing pain Inability to urinate Follow-up  You should have follow up with your urologist and radiation oncologist about 3 weeks after the procedure.   Post Anesthesia Home Care Instructions  Activity: Get plenty of rest for the remainder of the day. A responsible individual must stay with you for 24 hours following the procedure.  For the next 24 hours, DO NOT: -Drive a car -Advertising copywriter -Drink alcoholic beverages -Take any medication unless instructed by your physician -Make any legal decisions or sign important  papers.  Meals: Start with liquid foods such as gelatin or soup. Progress to regular foods as tolerated. Avoid greasy, spicy, heavy foods. If nausea and/or vomiting occur, drink only clear liquids until the nausea and/or vomiting subsides. Call your physician if vomiting continues.  Special Instructions/Symptoms: Your throat may feel dry or sore from the anesthesia or the breathing tube placed in your throat during surgery. If this causes discomfort, gargle with warm salt water. The discomfort should disappear within 24 hours.  May take Tylenol beginning at 27 Noon today as needed for soreness/discomfort.

## 2023-02-06 ENCOUNTER — Encounter (HOSPITAL_COMMUNITY): Payer: Self-pay | Admitting: Urology

## 2023-02-07 ENCOUNTER — Ambulatory Visit
Admission: RE | Admit: 2023-02-07 | Discharge: 2023-02-07 | Disposition: A | Discharge status: Home / Self Care | Payer: Medicare PPO | Source: Ambulatory Visit | Attending: Radiation Oncology | Admitting: Radiation Oncology

## 2023-02-07 ENCOUNTER — Other Ambulatory Visit: Payer: Self-pay

## 2023-02-07 DIAGNOSIS — C61 Malignant neoplasm of prostate: Secondary | ICD-10-CM

## 2023-02-07 DIAGNOSIS — Z191 Hormone sensitive malignancy status: Secondary | ICD-10-CM | POA: Diagnosis not present

## 2023-02-07 NOTE — Progress Notes (Signed)
  Radiation Oncology         [phone] ________________________________  Name: Cameron Huerta MRN: [id number]  Date: 02/07/2023  DOB: [birth year]  SIMULATION AND TREATMENT PLANNING NOTE    ICD-10-CM   1. Malignant neoplasm of prostate (HCC)  C61       DIAGNOSIS:    67 y.o. gentleman with Stage T1c adenocarcinoma of the prostate with Gleason Score of 3+4, and PSA of 7.28.   NARRATIVE:  The patient was brought to the CT Simulation planning suite.  Identity was confirmed.  All relevant records and images related to the planned course of therapy were reviewed.  The patient freely provided informed written consent to proceed with treatment after reviewing the details related to the planned course of therapy. The consent form was witnessed and verified by the simulation staff.  Then, the patient was set-up in a stable reproducible supine position for radiation therapy.  A vacuum lock pillow device was custom fabricated to position his legs in a reproducible immobilized position.  Then, I performed a urethrogram under sterile conditions to identify the prostatic apex.  CT images were obtained.  Surface markings were placed.  The CT images were loaded into the planning software.  Then the prostate target and avoidance structures including the rectum, bladder, bowel and hips were contoured.  Treatment planning then occurred.  The radiation prescription was entered and confirmed.  A total of one complex treatment devices was fabricated. I have requested : Intensity Modulated Radiotherapy (IMRT) is medically necessary for this case for the following reason:  Rectal sparing.Marland Kitchen  PLAN:  The patient will receive 70 Gy in 28 fractions.  ________________________________  Artist Pais Kathrynn Running, M.D.

## 2023-02-18 DIAGNOSIS — Z510 Encounter for antineoplastic radiation therapy: Secondary | ICD-10-CM | POA: Insufficient documentation

## 2023-02-18 DIAGNOSIS — C61 Malignant neoplasm of prostate: Secondary | ICD-10-CM | POA: Diagnosis not present

## 2023-02-18 DIAGNOSIS — Z191 Hormone sensitive malignancy status: Secondary | ICD-10-CM | POA: Diagnosis not present

## 2023-02-24 ENCOUNTER — Ambulatory Visit: Payer: Medicare PPO

## 2023-02-25 ENCOUNTER — Encounter: Payer: Self-pay | Admitting: Internal Medicine

## 2023-02-25 ENCOUNTER — Other Ambulatory Visit: Payer: Self-pay

## 2023-02-25 ENCOUNTER — Ambulatory Visit
Admission: RE | Admit: 2023-02-25 | Discharge: 2023-02-25 | Disposition: A | Discharge status: Home / Self Care | Payer: Medicare PPO | Source: Ambulatory Visit | Attending: Radiation Oncology | Admitting: Radiation Oncology

## 2023-02-25 DIAGNOSIS — Z510 Encounter for antineoplastic radiation therapy: Secondary | ICD-10-CM | POA: Diagnosis not present

## 2023-02-25 DIAGNOSIS — Z191 Hormone sensitive malignancy status: Secondary | ICD-10-CM | POA: Diagnosis not present

## 2023-02-25 DIAGNOSIS — C61 Malignant neoplasm of prostate: Secondary | ICD-10-CM | POA: Diagnosis not present

## 2023-02-25 LAB — RAD ONC ARIA SESSION SUMMARY
Course Elapsed Days: 0
Plan Fractions Treated to Date: 1
Plan Prescribed Dose Per Fraction: 2.5 Gy
Plan Total Fractions Prescribed: 28
Plan Total Prescribed Dose: 70 Gy
Reference Point Dosage Given to Date: 2.5 Gy
Reference Point Session Dosage Given: 2.5 Gy
Session Number: 1

## 2023-02-25 NOTE — Progress Notes (Signed)
TO BE COMPLETED BY RADIATION ONCOLOGIST OFFICE:   Patient Name: Cameron Huerta   Date of Birth: 1955/12/22   Radiation Oncologist: Pleas Koch   Site to be Treated: Prostate  Will x-rays >10 MV be used? No  Will the radiation be >10 cm from the device? Yes  Planned Treatment Start Date:   TO BE COMPLETED BY CARDIOLOGIST OFFICE:   Device Information:  Pacemaker [x]      ICD []    Brand: Biotronik: [phone] Model #: Biotronik [id number] [place] 8 DR-T  Serial Number: [id number]     Date of Placement: Chest  Site of Placement: 12/02/2022  Remote Device Check--Frequency: Every 91 days   Last Check: 12/13/2022  Is the Patient Pacer Dependent?:  Yes [x]   No []   Does cardiologist request Radiation Oncology to schedule device testing by vendor for the following:  Prior to the Initiation of Treatments?  Yes []  No [x]  During Treatments?  Yes []  No [x]  Post Radiation Treatments?  Yes []  No [x]   Is device monitoring necessary by vendor/cardiologist team during treatments?  Yes []   No [x]   Is cardiac monitoring by Radiation Oncology nursing necessary during treatments? Yes []   No [x]   Do you recommend device be relocated prior to Radiation Treatment? Yes []   No [x]   **PLEASE LIST ANY NOTES OR SPECIAL REQUESTS:       CARDIOLOGIST SIGNATURE:  Dr. Lewayne Bunting Per Device Clinic Standing Orders, Lenor Coffin  02/25/2023 8:19 AM  **Please route completed form back to Radiation Oncology Nursing and "P [facility] RAD ONC ADMIN", OR send an update if there will be a delay in having form completed by expected start date.  **Call [phone] if you have any questions or do not get an in-basket response from a Radiation Oncology staff member

## 2023-02-26 ENCOUNTER — Other Ambulatory Visit: Payer: Self-pay

## 2023-02-26 ENCOUNTER — Ambulatory Visit
Admission: RE | Admit: 2023-02-26 | Discharge: 2023-02-26 | Disposition: A | Discharge status: Home / Self Care | Payer: Medicare PPO | Source: Ambulatory Visit | Attending: Radiation Oncology | Admitting: Radiation Oncology

## 2023-02-26 DIAGNOSIS — Z510 Encounter for antineoplastic radiation therapy: Secondary | ICD-10-CM | POA: Diagnosis not present

## 2023-02-26 DIAGNOSIS — Z191 Hormone sensitive malignancy status: Secondary | ICD-10-CM | POA: Diagnosis not present

## 2023-02-26 DIAGNOSIS — C61 Malignant neoplasm of prostate: Secondary | ICD-10-CM | POA: Diagnosis not present

## 2023-02-26 LAB — RAD ONC ARIA SESSION SUMMARY
Course Elapsed Days: 1
Plan Fractions Treated to Date: 2
Plan Prescribed Dose Per Fraction: 2.5 Gy
Plan Total Fractions Prescribed: 28
Plan Total Prescribed Dose: 70 Gy
Reference Point Dosage Given to Date: 5 Gy
Reference Point Session Dosage Given: 2.5 Gy
Session Number: 2

## 2023-02-27 ENCOUNTER — Other Ambulatory Visit: Payer: Self-pay

## 2023-02-27 ENCOUNTER — Ambulatory Visit
Admission: RE | Admit: 2023-02-27 | Discharge: 2023-02-27 | Disposition: A | Discharge status: Home / Self Care | Payer: Medicare PPO | Source: Ambulatory Visit | Attending: Radiation Oncology | Admitting: Radiation Oncology

## 2023-02-27 DIAGNOSIS — Z510 Encounter for antineoplastic radiation therapy: Secondary | ICD-10-CM | POA: Diagnosis not present

## 2023-02-27 DIAGNOSIS — Z191 Hormone sensitive malignancy status: Secondary | ICD-10-CM | POA: Diagnosis not present

## 2023-02-27 DIAGNOSIS — C61 Malignant neoplasm of prostate: Secondary | ICD-10-CM | POA: Diagnosis not present

## 2023-02-27 LAB — RAD ONC ARIA SESSION SUMMARY
Course Elapsed Days: 2
Plan Fractions Treated to Date: 3
Plan Prescribed Dose Per Fraction: 2.5 Gy
Plan Total Fractions Prescribed: 28
Plan Total Prescribed Dose: 70 Gy
Reference Point Dosage Given to Date: 7.5 Gy
Reference Point Session Dosage Given: 2.5 Gy
Session Number: 3

## 2023-02-28 ENCOUNTER — Other Ambulatory Visit: Payer: Self-pay

## 2023-02-28 ENCOUNTER — Ambulatory Visit
Admission: RE | Admit: 2023-02-28 | Discharge: 2023-02-28 | Disposition: A | Discharge status: Home / Self Care | Payer: Medicare PPO | Source: Ambulatory Visit | Attending: Radiation Oncology | Admitting: Radiation Oncology

## 2023-02-28 DIAGNOSIS — Z191 Hormone sensitive malignancy status: Secondary | ICD-10-CM | POA: Diagnosis not present

## 2023-02-28 DIAGNOSIS — C61 Malignant neoplasm of prostate: Secondary | ICD-10-CM | POA: Diagnosis not present

## 2023-02-28 DIAGNOSIS — Z510 Encounter for antineoplastic radiation therapy: Secondary | ICD-10-CM | POA: Diagnosis not present

## 2023-02-28 LAB — RAD ONC ARIA SESSION SUMMARY
Course Elapsed Days: 3
Plan Fractions Treated to Date: 4
Plan Prescribed Dose Per Fraction: 2.5 Gy
Plan Total Fractions Prescribed: 28
Plan Total Prescribed Dose: 70 Gy
Reference Point Dosage Given to Date: 10 Gy
Reference Point Session Dosage Given: 2.5 Gy
Session Number: 4

## 2023-03-03 ENCOUNTER — Ambulatory Visit: Payer: Medicare PPO

## 2023-03-04 ENCOUNTER — Ambulatory Visit
Admission: RE | Admit: 2023-03-04 | Discharge: 2023-03-04 | Disposition: A | Discharge status: Home / Self Care | Payer: Medicare PPO | Source: Ambulatory Visit | Attending: Radiation Oncology | Admitting: Radiation Oncology

## 2023-03-04 ENCOUNTER — Other Ambulatory Visit: Payer: Self-pay

## 2023-03-04 DIAGNOSIS — Z510 Encounter for antineoplastic radiation therapy: Secondary | ICD-10-CM | POA: Diagnosis not present

## 2023-03-04 DIAGNOSIS — C61 Malignant neoplasm of prostate: Secondary | ICD-10-CM | POA: Diagnosis not present

## 2023-03-04 DIAGNOSIS — Z191 Hormone sensitive malignancy status: Secondary | ICD-10-CM | POA: Diagnosis not present

## 2023-03-04 LAB — RAD ONC ARIA SESSION SUMMARY
Course Elapsed Days: 7
Plan Fractions Treated to Date: 5
Plan Prescribed Dose Per Fraction: 2.5 Gy
Plan Total Fractions Prescribed: 28
Plan Total Prescribed Dose: 70 Gy
Reference Point Dosage Given to Date: 12.5 Gy
Reference Point Session Dosage Given: 2.5 Gy
Session Number: 5

## 2023-03-05 ENCOUNTER — Ambulatory Visit
Admission: RE | Admit: 2023-03-05 | Discharge: 2023-03-05 | Disposition: A | Discharge status: Home / Self Care | Payer: Medicare PPO | Source: Ambulatory Visit | Attending: Radiation Oncology | Admitting: Radiation Oncology

## 2023-03-05 ENCOUNTER — Other Ambulatory Visit: Payer: Self-pay

## 2023-03-05 DIAGNOSIS — Z191 Hormone sensitive malignancy status: Secondary | ICD-10-CM | POA: Diagnosis not present

## 2023-03-05 DIAGNOSIS — Z510 Encounter for antineoplastic radiation therapy: Secondary | ICD-10-CM | POA: Diagnosis not present

## 2023-03-05 DIAGNOSIS — C61 Malignant neoplasm of prostate: Secondary | ICD-10-CM | POA: Diagnosis not present

## 2023-03-05 LAB — RAD ONC ARIA SESSION SUMMARY
Course Elapsed Days: 8
Plan Fractions Treated to Date: 6
Plan Prescribed Dose Per Fraction: 2.5 Gy
Plan Total Fractions Prescribed: 28
Plan Total Prescribed Dose: 70 Gy
Reference Point Dosage Given to Date: 15 Gy
Reference Point Session Dosage Given: 2.5 Gy
Session Number: 6

## 2023-03-06 ENCOUNTER — Other Ambulatory Visit: Payer: Self-pay

## 2023-03-06 ENCOUNTER — Ambulatory Visit (INDEPENDENT_AMBULATORY_CARE_PROVIDER_SITE_OTHER): Payer: Medicare PPO

## 2023-03-06 ENCOUNTER — Ambulatory Visit
Admission: RE | Admit: 2023-03-06 | Discharge: 2023-03-06 | Disposition: A | Discharge status: Home / Self Care | Payer: Medicare PPO | Source: Ambulatory Visit | Attending: Radiation Oncology | Admitting: Radiation Oncology

## 2023-03-06 DIAGNOSIS — C61 Malignant neoplasm of prostate: Secondary | ICD-10-CM | POA: Diagnosis not present

## 2023-03-06 DIAGNOSIS — I442 Atrioventricular block, complete: Secondary | ICD-10-CM | POA: Diagnosis not present

## 2023-03-06 DIAGNOSIS — Z510 Encounter for antineoplastic radiation therapy: Secondary | ICD-10-CM | POA: Diagnosis not present

## 2023-03-06 DIAGNOSIS — Z191 Hormone sensitive malignancy status: Secondary | ICD-10-CM | POA: Diagnosis not present

## 2023-03-06 LAB — CUP PACEART REMOTE DEVICE CHECK
Battery Voltage: 100
Date Time Interrogation Session: 20240523074407
Implantable Lead Connection Status: 753985
Implantable Lead Connection Status: 753985
Implantable Lead Implant Date: 20240219
Implantable Lead Implant Date: 20240219
Implantable Lead Location: 753859
Implantable Lead Location: 753860
Implantable Lead Model: 377
Implantable Lead Model: 377
Implantable Lead Serial Number: 8001238269
Implantable Lead Serial Number: 8001307092
Implantable Pulse Generator Implant Date: 20240219
Pulse Gen Model: 407145
Pulse Gen Serial Number: 1000156359

## 2023-03-06 LAB — RAD ONC ARIA SESSION SUMMARY
Course Elapsed Days: 9
Plan Fractions Treated to Date: 7
Plan Prescribed Dose Per Fraction: 2.5 Gy
Plan Total Fractions Prescribed: 28
Plan Total Prescribed Dose: 70 Gy
Reference Point Dosage Given to Date: 17.5 Gy
Reference Point Session Dosage Given: 2.5 Gy
Session Number: 7

## 2023-03-07 ENCOUNTER — Ambulatory Visit
Admission: RE | Admit: 2023-03-07 | Discharge: 2023-03-07 | Disposition: A | Discharge status: Home / Self Care | Payer: Medicare PPO | Source: Ambulatory Visit | Attending: Radiation Oncology | Admitting: Radiation Oncology

## 2023-03-07 ENCOUNTER — Other Ambulatory Visit: Payer: Self-pay

## 2023-03-07 DIAGNOSIS — Z510 Encounter for antineoplastic radiation therapy: Secondary | ICD-10-CM | POA: Diagnosis not present

## 2023-03-07 DIAGNOSIS — C61 Malignant neoplasm of prostate: Secondary | ICD-10-CM | POA: Diagnosis not present

## 2023-03-07 DIAGNOSIS — Z191 Hormone sensitive malignancy status: Secondary | ICD-10-CM | POA: Diagnosis not present

## 2023-03-07 LAB — RAD ONC ARIA SESSION SUMMARY
Course Elapsed Days: 10
Plan Fractions Treated to Date: 8
Plan Prescribed Dose Per Fraction: 2.5 Gy
Plan Total Fractions Prescribed: 28
Plan Total Prescribed Dose: 70 Gy
Reference Point Dosage Given to Date: 20 Gy
Reference Point Session Dosage Given: 2.5 Gy
Session Number: 8

## 2023-03-11 ENCOUNTER — Ambulatory Visit
Admission: RE | Admit: 2023-03-11 | Discharge: 2023-03-11 | Disposition: A | Discharge status: Home / Self Care | Payer: Medicare PPO | Source: Ambulatory Visit | Attending: Radiation Oncology | Admitting: Radiation Oncology

## 2023-03-11 ENCOUNTER — Other Ambulatory Visit: Payer: Self-pay

## 2023-03-11 DIAGNOSIS — C61 Malignant neoplasm of prostate: Secondary | ICD-10-CM | POA: Diagnosis not present

## 2023-03-11 DIAGNOSIS — Z510 Encounter for antineoplastic radiation therapy: Secondary | ICD-10-CM | POA: Diagnosis not present

## 2023-03-11 DIAGNOSIS — Z191 Hormone sensitive malignancy status: Secondary | ICD-10-CM | POA: Diagnosis not present

## 2023-03-11 LAB — RAD ONC ARIA SESSION SUMMARY
Course Elapsed Days: 14
Plan Fractions Treated to Date: 9
Plan Prescribed Dose Per Fraction: 2.5 Gy
Plan Total Fractions Prescribed: 28
Plan Total Prescribed Dose: 70 Gy
Reference Point Dosage Given to Date: 22.5 Gy
Reference Point Session Dosage Given: 2.5 Gy
Session Number: 9

## 2023-03-12 ENCOUNTER — Ambulatory Visit
Admission: RE | Admit: 2023-03-12 | Discharge: 2023-03-12 | Disposition: A | Discharge status: Home / Self Care | Payer: Medicare PPO | Source: Ambulatory Visit | Attending: Radiation Oncology | Admitting: Radiation Oncology

## 2023-03-12 ENCOUNTER — Other Ambulatory Visit: Payer: Self-pay

## 2023-03-12 DIAGNOSIS — Z191 Hormone sensitive malignancy status: Secondary | ICD-10-CM | POA: Diagnosis not present

## 2023-03-12 DIAGNOSIS — C61 Malignant neoplasm of prostate: Secondary | ICD-10-CM | POA: Diagnosis not present

## 2023-03-12 DIAGNOSIS — Z510 Encounter for antineoplastic radiation therapy: Secondary | ICD-10-CM | POA: Diagnosis not present

## 2023-03-12 LAB — RAD ONC ARIA SESSION SUMMARY
Course Elapsed Days: 15
Plan Fractions Treated to Date: 10
Plan Prescribed Dose Per Fraction: 2.5 Gy
Plan Total Fractions Prescribed: 28
Plan Total Prescribed Dose: 70 Gy
Reference Point Dosage Given to Date: 25 Gy
Reference Point Session Dosage Given: 2.5 Gy
Session Number: 10

## 2023-03-13 ENCOUNTER — Other Ambulatory Visit: Payer: Self-pay

## 2023-03-13 ENCOUNTER — Ambulatory Visit: Payer: Medicare PPO | Attending: Internal Medicine | Admitting: Internal Medicine

## 2023-03-13 ENCOUNTER — Encounter: Payer: Self-pay | Admitting: Internal Medicine

## 2023-03-13 ENCOUNTER — Ambulatory Visit
Admission: RE | Admit: 2023-03-13 | Discharge: 2023-03-13 | Disposition: A | Discharge status: Home / Self Care | Payer: Medicare PPO | Source: Ambulatory Visit | Attending: Radiation Oncology | Admitting: Radiation Oncology

## 2023-03-13 VITALS — BP 118/78 | HR 70 | Ht 74.0 in | Wt 229.0 lb

## 2023-03-13 DIAGNOSIS — I10 Essential (primary) hypertension: Secondary | ICD-10-CM | POA: Diagnosis not present

## 2023-03-13 DIAGNOSIS — Z510 Encounter for antineoplastic radiation therapy: Secondary | ICD-10-CM | POA: Diagnosis not present

## 2023-03-13 DIAGNOSIS — I442 Atrioventricular block, complete: Secondary | ICD-10-CM | POA: Diagnosis not present

## 2023-03-13 DIAGNOSIS — Z191 Hormone sensitive malignancy status: Secondary | ICD-10-CM | POA: Diagnosis not present

## 2023-03-13 DIAGNOSIS — C61 Malignant neoplasm of prostate: Secondary | ICD-10-CM | POA: Diagnosis not present

## 2023-03-13 LAB — RAD ONC ARIA SESSION SUMMARY
Course Elapsed Days: 16
Plan Fractions Treated to Date: 11
Plan Prescribed Dose Per Fraction: 2.5 Gy
Plan Total Fractions Prescribed: 28
Plan Total Prescribed Dose: 70 Gy
Reference Point Dosage Given to Date: 27.5 Gy
Reference Point Session Dosage Given: 2.5 Gy
Session Number: 11

## 2023-03-13 NOTE — Progress Notes (Signed)
HPI [name] returns today for followup. He is a pleasant 67 yo man with a h/o high grade heart block, syncope, and HTN. He is s/p PPM insertion about 3 months ago. He has done well in the interim with no chest pain, sob, or syncope. Allergies  Allergen Reactions   Morphine And Codeine Anaphylaxis   Gluten Meal Other (See Comments)    Prefers not to eat it   Scallops [Shellfish Allergy] Itching and Nausea And Vomiting    GI upset, also  Sea Bass specifically      Current Outpatient Medications  Medication Sig Dispense Refill   losartan-hydrochlorothiazide (HYZAAR) 50-12.5 MG tablet Take 1 tablet by mouth daily.     Multiple Vitamin (MULTIVITAMIN WITH MINERALS) TABS tablet Take 1 tablet by mouth in the morning and at bedtime.     Probiotic Product (PROBIOTIC PO) Take 1 capsule by mouth 2 (two) times daily.     rivaroxaban (XARELTO) 20 MG TABS tablet Take 1 tablet (20 mg total) by mouth daily with supper. 30 tablet 0   rosuvastatin (CRESTOR) 5 MG tablet Take 5 mg by mouth daily.     No current facility-administered medications for this visit.     Past Medical History:  Diagnosis Date   Abnormality of thoracic aorta    mildly dialated 3.7 cm   Cancer (HCC)    prostate   Complete heart block (HCC)    First degree AV block    History of nuclear stress test    Abnormal   Hypertension    Presence of permanent cardiac pacemaker    Pulmonary embolism (HCC) 10/2013   Syncope    after abrupt cessation of exercise   Typical atrial flutter (HCC)    Italy score - 0    ROS:   All systems reviewed and negative except as noted in the HPI.   Past Surgical History:  Procedure Laterality Date   COLONOSCOPY WITH PROPOFOL N/A 12/18/2018   Procedure: COLONOSCOPY WITH PROPOFOL;  Surgeon: Jeani Hawking, MD;  [facility];  Service: Endoscopy;  Laterality: N/A;   GOLD SEED IMPLANT N/A 02/05/2023   Procedure: GOLD SEED IMPLANT;  Surgeon: Crista Elliot, MD;  Location:  [facility];  Service: Urology;  Laterality: N/A;  30 MINS   PACEMAKER IMPLANT N/A 12/02/2022   Procedure: PACEMAKER IMPLANT;  Surgeon: Marinus Maw, MD;  Location: [facility] INVASIVE CV LAB;  Service: Cardiovascular;  Laterality: N/A;   POLYPECTOMY  12/18/2018   Procedure: POLYPECTOMY;  Surgeon: Jeani Hawking, MD;  Location: [facility];  Service: Endoscopy;;   SPACE OAR INSTILLATION N/A 02/05/2023   Procedure: SPACE OAR INSTILLATION;  Surgeon: Crista Elliot, MD;  Location: [facility];  Service: Urology;  Laterality: N/A;     Family History  Problem Relation Age of Onset   Hypertension Father    Heart attack Father      Social History   Socioeconomic History   Marital status: Married    Spouse name: Not on file   Number of children: Not on file   Years of education: Not on file   Highest education level: Not on file  Occupational History   Not on file  Tobacco Use   Smoking status: Never   Smokeless tobacco: Never  Vaping Use   Vaping Use: Never used  Substance and Sexual Activity   Alcohol use: No   Drug use: No   Sexual activity: Not Currently  Other Topics Concern  Not on file  Social History Narrative   Not on file   Social Determinants of Health   Financial Resource Strain: Not on file  Food Insecurity: Not on file  Transportation Needs: Not on file  Physical Activity: Not on file  Stress: Not on file  Social Connections: Not on file  Intimate Partner Violence: Not on file     BP 118/78   Pulse 70   Ht 6\' 2"  (1.88 m)   Wt 229 lb (103.9 kg)   SpO2 97%   BMI 29.40 kg/m   Physical Exam:  Well appearing NAD HEENT: Unremarkable Neck:  No JVD, no thyromegally Lymphatics:  No adenopathy Back:  No CVA tenderness Lungs:  Clear with no wheezes HEART:  Regular rate rhythm, no murmurs, no rubs, no clicks Abd:  soft, positive bowel sounds, no organomegally, no rebound, no guarding Ext:  2 plus pulses, no edema, no cyanosis, no clubbing Skin:  No rashes no  nodules Neuro:  CN II through XII intact, motor grossly intact  EKG - nsr with P synch ventricular pacing  DEVICE  Normal device function.  See PaceArt for details.   Assess/Plan: High grade heart block - today he had an escape of 38/min. He will continue his current treamtent. He is asymptomatic. PPM -his Biotronik DDD PM is working normally. His rate response is a little blunted. We turned down his outputs.  Cameron Gowda Bryssa Tones,MD

## 2023-03-13 NOTE — Patient Instructions (Signed)
Medication Instructions:  Your physician recommends that you continue on your current medications as directed. Please refer to the Current Medication list given to you today.  *If you need a refill on your cardiac medications before your next appointment, please call your pharmacy*  Lab Work: None ordered.  If you have labs (blood work) drawn today and your tests are completely normal, you will receive your results only by: MyChart Message (if you have MyChart) OR A paper copy in the mail If you have any lab test that is abnormal or we need to change your treatment, we will call you to review the results.  Testing/Procedures: None ordered.  Follow-Up: At [facility], you and your health needs are our priority.  As part of our continuing mission to provide you with exceptional heart care, we have created designated Provider Care Teams.  These Care Teams include your primary Cardiologist (physician) and Advanced Practice Providers (APPs -  Physician Assistants and Nurse Practitioners) who all work together to provide you with the care you need, when you need it.  We recommend signing up for the patient portal called "MyChart".  Sign up information is provided on this After Visit Summary.  MyChart is used to connect with patients for Virtual Visits (Telemedicine).  Patients are able to view lab/test results, encounter notes, upcoming appointments, etc.  Non-urgent messages can be sent to your provider as well.   To learn more about what you can do with MyChart, go to [email or web address].    Your next appointment:   1 year(s)  The format for your next appointment:   In Person  Provider:   Lewayne Bunting, MD{or one of the following Advanced Practice Providers on your designated Care Team:   Francis Dowse, [place] Casimiro Needle "Mardelle Matte" Lanna Poche, [place]  Remote monitoring is used to monitor your Pacemaker from home. This monitoring reduces the number of office visits required to check your device to  one time per year. It allows [place] to keep an eye on the functioning of your device to ensure it is working properly. You are scheduled for a device check from home on 8/22. You may send your transmission at any time that day. If you have a wireless device, the transmission will be sent automatically. After your physician reviews your transmission, you will receive a postcard with your next transmission date.

## 2023-03-14 ENCOUNTER — Other Ambulatory Visit: Payer: Self-pay

## 2023-03-14 ENCOUNTER — Ambulatory Visit
Admission: RE | Admit: 2023-03-14 | Discharge: 2023-03-14 | Disposition: A | Discharge status: Home / Self Care | Payer: Medicare PPO | Source: Ambulatory Visit | Attending: Radiation Oncology | Admitting: Radiation Oncology

## 2023-03-14 DIAGNOSIS — Z191 Hormone sensitive malignancy status: Secondary | ICD-10-CM | POA: Diagnosis not present

## 2023-03-14 DIAGNOSIS — Z510 Encounter for antineoplastic radiation therapy: Secondary | ICD-10-CM | POA: Diagnosis not present

## 2023-03-14 DIAGNOSIS — C61 Malignant neoplasm of prostate: Secondary | ICD-10-CM | POA: Diagnosis not present

## 2023-03-14 LAB — RAD ONC ARIA SESSION SUMMARY
Course Elapsed Days: 17
Plan Fractions Treated to Date: 12
Plan Prescribed Dose Per Fraction: 2.5 Gy
Plan Total Fractions Prescribed: 28
Plan Total Prescribed Dose: 70 Gy
Reference Point Dosage Given to Date: 30 Gy
Reference Point Session Dosage Given: 2.5 Gy
Session Number: 12

## 2023-03-17 ENCOUNTER — Other Ambulatory Visit: Payer: Self-pay

## 2023-03-17 ENCOUNTER — Ambulatory Visit
Admission: RE | Admit: 2023-03-17 | Discharge: 2023-03-17 | Disposition: A | Discharge status: Home / Self Care | Payer: Medicare PPO | Source: Ambulatory Visit | Attending: Radiation Oncology | Admitting: Radiation Oncology

## 2023-03-17 DIAGNOSIS — C61 Malignant neoplasm of prostate: Secondary | ICD-10-CM | POA: Insufficient documentation

## 2023-03-17 DIAGNOSIS — Z510 Encounter for antineoplastic radiation therapy: Secondary | ICD-10-CM | POA: Insufficient documentation

## 2023-03-17 DIAGNOSIS — Z191 Hormone sensitive malignancy status: Secondary | ICD-10-CM | POA: Diagnosis not present

## 2023-03-17 LAB — RAD ONC ARIA SESSION SUMMARY
Course Elapsed Days: 20
Plan Fractions Treated to Date: 13
Plan Prescribed Dose Per Fraction: 2.5 Gy
Plan Total Fractions Prescribed: 28
Plan Total Prescribed Dose: 70 Gy
Reference Point Dosage Given to Date: 32.5 Gy
Reference Point Session Dosage Given: 2.5 Gy
Session Number: 13

## 2023-03-18 ENCOUNTER — Ambulatory Visit
Admission: RE | Admit: 2023-03-18 | Discharge: 2023-03-18 | Disposition: A | Discharge status: Home / Self Care | Payer: Medicare PPO | Source: Ambulatory Visit | Attending: Radiation Oncology | Admitting: Radiation Oncology

## 2023-03-18 ENCOUNTER — Other Ambulatory Visit: Payer: Self-pay

## 2023-03-18 DIAGNOSIS — C61 Malignant neoplasm of prostate: Secondary | ICD-10-CM | POA: Diagnosis not present

## 2023-03-18 DIAGNOSIS — Z510 Encounter for antineoplastic radiation therapy: Secondary | ICD-10-CM | POA: Diagnosis not present

## 2023-03-18 DIAGNOSIS — Z191 Hormone sensitive malignancy status: Secondary | ICD-10-CM | POA: Diagnosis not present

## 2023-03-18 LAB — RAD ONC ARIA SESSION SUMMARY
Course Elapsed Days: 21
Plan Fractions Treated to Date: 14
Plan Prescribed Dose Per Fraction: 2.5 Gy
Plan Total Fractions Prescribed: 28
Plan Total Prescribed Dose: 70 Gy
Reference Point Dosage Given to Date: 35 Gy
Reference Point Session Dosage Given: 2.5 Gy
Session Number: 14

## 2023-03-19 ENCOUNTER — Other Ambulatory Visit: Payer: Self-pay

## 2023-03-19 ENCOUNTER — Ambulatory Visit
Admission: RE | Admit: 2023-03-19 | Discharge: 2023-03-19 | Disposition: A | Discharge status: Home / Self Care | Payer: Medicare PPO | Source: Ambulatory Visit | Attending: Radiation Oncology | Admitting: Radiation Oncology

## 2023-03-19 DIAGNOSIS — Z191 Hormone sensitive malignancy status: Secondary | ICD-10-CM | POA: Diagnosis not present

## 2023-03-19 DIAGNOSIS — Z510 Encounter for antineoplastic radiation therapy: Secondary | ICD-10-CM | POA: Diagnosis not present

## 2023-03-19 DIAGNOSIS — C61 Malignant neoplasm of prostate: Secondary | ICD-10-CM | POA: Diagnosis not present

## 2023-03-19 LAB — RAD ONC ARIA SESSION SUMMARY
Course Elapsed Days: 22
Plan Fractions Treated to Date: 15
Plan Prescribed Dose Per Fraction: 2.5 Gy
Plan Total Fractions Prescribed: 28
Plan Total Prescribed Dose: 70 Gy
Reference Point Dosage Given to Date: 37.5 Gy
Reference Point Session Dosage Given: 2.5 Gy
Session Number: 15

## 2023-03-20 ENCOUNTER — Other Ambulatory Visit: Payer: Self-pay

## 2023-03-20 ENCOUNTER — Ambulatory Visit
Admission: RE | Admit: 2023-03-20 | Discharge: 2023-03-20 | Disposition: A | Discharge status: Home / Self Care | Payer: Medicare PPO | Source: Ambulatory Visit | Attending: Radiation Oncology | Admitting: Radiation Oncology

## 2023-03-20 DIAGNOSIS — Z510 Encounter for antineoplastic radiation therapy: Secondary | ICD-10-CM | POA: Diagnosis not present

## 2023-03-20 DIAGNOSIS — Z191 Hormone sensitive malignancy status: Secondary | ICD-10-CM | POA: Diagnosis not present

## 2023-03-20 DIAGNOSIS — C61 Malignant neoplasm of prostate: Secondary | ICD-10-CM | POA: Diagnosis not present

## 2023-03-20 LAB — RAD ONC ARIA SESSION SUMMARY
Course Elapsed Days: 23
Plan Fractions Treated to Date: 16
Plan Prescribed Dose Per Fraction: 2.5 Gy
Plan Total Fractions Prescribed: 28
Plan Total Prescribed Dose: 70 Gy
Reference Point Dosage Given to Date: 40 Gy
Reference Point Session Dosage Given: 2.5 Gy
Session Number: 16

## 2023-03-21 ENCOUNTER — Other Ambulatory Visit: Payer: Self-pay

## 2023-03-21 ENCOUNTER — Ambulatory Visit
Admission: RE | Admit: 2023-03-21 | Discharge: 2023-03-21 | Disposition: A | Discharge status: Home / Self Care | Payer: Medicare PPO | Source: Ambulatory Visit | Attending: Radiation Oncology | Admitting: Radiation Oncology

## 2023-03-21 DIAGNOSIS — Z191 Hormone sensitive malignancy status: Secondary | ICD-10-CM | POA: Diagnosis not present

## 2023-03-21 DIAGNOSIS — Z510 Encounter for antineoplastic radiation therapy: Secondary | ICD-10-CM | POA: Diagnosis not present

## 2023-03-21 DIAGNOSIS — C61 Malignant neoplasm of prostate: Secondary | ICD-10-CM | POA: Diagnosis not present

## 2023-03-21 LAB — RAD ONC ARIA SESSION SUMMARY
Course Elapsed Days: 24
Plan Fractions Treated to Date: 17
Plan Prescribed Dose Per Fraction: 2.5 Gy
Plan Total Fractions Prescribed: 28
Plan Total Prescribed Dose: 70 Gy
Reference Point Dosage Given to Date: 42.5 Gy
Reference Point Session Dosage Given: 2.5 Gy
Session Number: 17

## 2023-03-24 ENCOUNTER — Ambulatory Visit
Admission: RE | Admit: 2023-03-24 | Discharge: 2023-03-24 | Disposition: A | Discharge status: Home / Self Care | Payer: Medicare PPO | Source: Ambulatory Visit | Attending: Radiation Oncology | Admitting: Radiation Oncology

## 2023-03-24 ENCOUNTER — Other Ambulatory Visit: Payer: Self-pay

## 2023-03-24 DIAGNOSIS — Z191 Hormone sensitive malignancy status: Secondary | ICD-10-CM | POA: Diagnosis not present

## 2023-03-24 DIAGNOSIS — C61 Malignant neoplasm of prostate: Secondary | ICD-10-CM | POA: Diagnosis not present

## 2023-03-24 DIAGNOSIS — Z510 Encounter for antineoplastic radiation therapy: Secondary | ICD-10-CM | POA: Diagnosis not present

## 2023-03-24 LAB — RAD ONC ARIA SESSION SUMMARY
Course Elapsed Days: 27
Plan Fractions Treated to Date: 18
Plan Prescribed Dose Per Fraction: 2.5 Gy
Plan Total Fractions Prescribed: 28
Plan Total Prescribed Dose: 70 Gy
Reference Point Dosage Given to Date: 45 Gy
Reference Point Session Dosage Given: 2.5 Gy
Session Number: 18

## 2023-03-25 ENCOUNTER — Ambulatory Visit
Admission: RE | Admit: 2023-03-25 | Discharge: 2023-03-25 | Disposition: A | Discharge status: Home / Self Care | Payer: Medicare PPO | Source: Ambulatory Visit | Attending: Radiation Oncology | Admitting: Radiation Oncology

## 2023-03-25 ENCOUNTER — Other Ambulatory Visit: Payer: Self-pay

## 2023-03-25 DIAGNOSIS — Z510 Encounter for antineoplastic radiation therapy: Secondary | ICD-10-CM | POA: Diagnosis not present

## 2023-03-25 DIAGNOSIS — C61 Malignant neoplasm of prostate: Secondary | ICD-10-CM | POA: Diagnosis not present

## 2023-03-25 DIAGNOSIS — Z191 Hormone sensitive malignancy status: Secondary | ICD-10-CM | POA: Diagnosis not present

## 2023-03-25 LAB — RAD ONC ARIA SESSION SUMMARY
Course Elapsed Days: 28
Plan Fractions Treated to Date: 19
Plan Prescribed Dose Per Fraction: 2.5 Gy
Plan Total Fractions Prescribed: 28
Plan Total Prescribed Dose: 70 Gy
Reference Point Dosage Given to Date: 47.5 Gy
Reference Point Session Dosage Given: 2.5 Gy
Session Number: 19

## 2023-03-26 ENCOUNTER — Ambulatory Visit: Payer: Medicare PPO

## 2023-03-26 NOTE — Progress Notes (Signed)
Remote pacemaker transmission.   

## 2023-03-27 ENCOUNTER — Ambulatory Visit
Admission: RE | Admit: 2023-03-27 | Discharge: 2023-03-27 | Disposition: A | Discharge status: Home / Self Care | Payer: Medicare PPO | Source: Ambulatory Visit | Attending: Radiation Oncology | Admitting: Radiation Oncology

## 2023-03-27 ENCOUNTER — Other Ambulatory Visit: Payer: Self-pay

## 2023-03-27 DIAGNOSIS — Z191 Hormone sensitive malignancy status: Secondary | ICD-10-CM | POA: Diagnosis not present

## 2023-03-27 DIAGNOSIS — Z510 Encounter for antineoplastic radiation therapy: Secondary | ICD-10-CM | POA: Diagnosis not present

## 2023-03-27 DIAGNOSIS — C61 Malignant neoplasm of prostate: Secondary | ICD-10-CM | POA: Diagnosis not present

## 2023-03-27 LAB — RAD ONC ARIA SESSION SUMMARY
Course Elapsed Days: 30
Plan Fractions Treated to Date: 20
Plan Prescribed Dose Per Fraction: 2.5 Gy
Plan Total Fractions Prescribed: 28
Plan Total Prescribed Dose: 70 Gy
Reference Point Dosage Given to Date: 50 Gy
Reference Point Session Dosage Given: 2.5 Gy
Session Number: 20

## 2023-03-28 ENCOUNTER — Other Ambulatory Visit: Payer: Self-pay

## 2023-03-28 ENCOUNTER — Ambulatory Visit
Admission: RE | Admit: 2023-03-28 | Discharge: 2023-03-28 | Disposition: A | Discharge status: Home / Self Care | Payer: Medicare PPO | Source: Ambulatory Visit | Attending: Radiation Oncology | Admitting: Radiation Oncology

## 2023-03-28 DIAGNOSIS — Z191 Hormone sensitive malignancy status: Secondary | ICD-10-CM | POA: Diagnosis not present

## 2023-03-28 DIAGNOSIS — C61 Malignant neoplasm of prostate: Secondary | ICD-10-CM | POA: Diagnosis not present

## 2023-03-28 DIAGNOSIS — Z510 Encounter for antineoplastic radiation therapy: Secondary | ICD-10-CM | POA: Diagnosis not present

## 2023-03-28 LAB — RAD ONC ARIA SESSION SUMMARY
Course Elapsed Days: 31
Plan Fractions Treated to Date: 21
Plan Prescribed Dose Per Fraction: 2.5 Gy
Plan Total Fractions Prescribed: 28
Plan Total Prescribed Dose: 70 Gy
Reference Point Dosage Given to Date: 52.5 Gy
Reference Point Session Dosage Given: 2.5 Gy
Session Number: 21

## 2023-03-31 ENCOUNTER — Ambulatory Visit
Admission: RE | Admit: 2023-03-31 | Discharge: 2023-03-31 | Disposition: A | Discharge status: Home / Self Care | Payer: Medicare PPO | Source: Ambulatory Visit | Attending: Radiation Oncology | Admitting: Radiation Oncology

## 2023-03-31 ENCOUNTER — Other Ambulatory Visit: Payer: Self-pay

## 2023-03-31 DIAGNOSIS — C61 Malignant neoplasm of prostate: Secondary | ICD-10-CM | POA: Diagnosis not present

## 2023-03-31 DIAGNOSIS — Z191 Hormone sensitive malignancy status: Secondary | ICD-10-CM | POA: Diagnosis not present

## 2023-03-31 DIAGNOSIS — Z510 Encounter for antineoplastic radiation therapy: Secondary | ICD-10-CM | POA: Diagnosis not present

## 2023-03-31 LAB — RAD ONC ARIA SESSION SUMMARY
Course Elapsed Days: 34
Plan Fractions Treated to Date: 22
Plan Prescribed Dose Per Fraction: 2.5 Gy
Plan Total Fractions Prescribed: 28
Plan Total Prescribed Dose: 70 Gy
Reference Point Dosage Given to Date: 55 Gy
Reference Point Session Dosage Given: 2.5 Gy
Session Number: 22

## 2023-04-01 ENCOUNTER — Ambulatory Visit
Admission: RE | Admit: 2023-04-01 | Discharge: 2023-04-01 | Disposition: A | Discharge status: Home / Self Care | Payer: Medicare PPO | Source: Ambulatory Visit | Attending: Radiation Oncology | Admitting: Radiation Oncology

## 2023-04-01 ENCOUNTER — Other Ambulatory Visit: Payer: Self-pay

## 2023-04-01 DIAGNOSIS — Z510 Encounter for antineoplastic radiation therapy: Secondary | ICD-10-CM | POA: Diagnosis not present

## 2023-04-01 DIAGNOSIS — Z191 Hormone sensitive malignancy status: Secondary | ICD-10-CM | POA: Diagnosis not present

## 2023-04-01 DIAGNOSIS — C61 Malignant neoplasm of prostate: Secondary | ICD-10-CM | POA: Diagnosis not present

## 2023-04-01 LAB — RAD ONC ARIA SESSION SUMMARY
Course Elapsed Days: 35
Plan Fractions Treated to Date: 23
Plan Prescribed Dose Per Fraction: 2.5 Gy
Plan Total Fractions Prescribed: 28
Plan Total Prescribed Dose: 70 Gy
Reference Point Dosage Given to Date: 57.5 Gy
Reference Point Session Dosage Given: 2.5 Gy
Session Number: 23

## 2023-04-02 ENCOUNTER — Ambulatory Visit
Admission: RE | Admit: 2023-04-02 | Discharge: 2023-04-02 | Disposition: A | Discharge status: Home / Self Care | Payer: Medicare PPO | Source: Ambulatory Visit | Attending: Radiation Oncology | Admitting: Radiation Oncology

## 2023-04-02 ENCOUNTER — Other Ambulatory Visit: Payer: Self-pay

## 2023-04-02 DIAGNOSIS — Z191 Hormone sensitive malignancy status: Secondary | ICD-10-CM | POA: Diagnosis not present

## 2023-04-02 DIAGNOSIS — C61 Malignant neoplasm of prostate: Secondary | ICD-10-CM | POA: Diagnosis not present

## 2023-04-02 DIAGNOSIS — Z510 Encounter for antineoplastic radiation therapy: Secondary | ICD-10-CM | POA: Diagnosis not present

## 2023-04-02 LAB — RAD ONC ARIA SESSION SUMMARY
Course Elapsed Days: 36
Plan Fractions Treated to Date: 24
Plan Prescribed Dose Per Fraction: 2.5 Gy
Plan Total Fractions Prescribed: 28
Plan Total Prescribed Dose: 70 Gy
Reference Point Dosage Given to Date: 60 Gy
Reference Point Session Dosage Given: 2.5 Gy
Session Number: 24

## 2023-04-03 ENCOUNTER — Other Ambulatory Visit: Payer: Self-pay

## 2023-04-03 ENCOUNTER — Ambulatory Visit: Payer: Medicare PPO

## 2023-04-03 ENCOUNTER — Ambulatory Visit
Admission: RE | Admit: 2023-04-03 | Discharge: 2023-04-03 | Disposition: A | Discharge status: Home / Self Care | Payer: Medicare PPO | Source: Ambulatory Visit | Attending: Radiation Oncology | Admitting: Radiation Oncology

## 2023-04-03 DIAGNOSIS — Z510 Encounter for antineoplastic radiation therapy: Secondary | ICD-10-CM | POA: Diagnosis not present

## 2023-04-03 DIAGNOSIS — C61 Malignant neoplasm of prostate: Secondary | ICD-10-CM | POA: Diagnosis not present

## 2023-04-03 DIAGNOSIS — Z191 Hormone sensitive malignancy status: Secondary | ICD-10-CM | POA: Diagnosis not present

## 2023-04-03 LAB — RAD ONC ARIA SESSION SUMMARY
Course Elapsed Days: 37
Plan Fractions Treated to Date: 25
Plan Prescribed Dose Per Fraction: 2.5 Gy
Plan Total Fractions Prescribed: 28
Plan Total Prescribed Dose: 70 Gy
Reference Point Dosage Given to Date: 62.5 Gy
Reference Point Session Dosage Given: 2.5 Gy
Session Number: 25

## 2023-04-04 ENCOUNTER — Ambulatory Visit: Payer: Medicare PPO

## 2023-04-04 ENCOUNTER — Ambulatory Visit
Admission: RE | Admit: 2023-04-04 | Discharge: 2023-04-04 | Disposition: A | Discharge status: Home / Self Care | Payer: Medicare PPO | Source: Ambulatory Visit | Attending: Radiation Oncology | Admitting: Radiation Oncology

## 2023-04-04 ENCOUNTER — Other Ambulatory Visit: Payer: Self-pay

## 2023-04-04 DIAGNOSIS — C61 Malignant neoplasm of prostate: Secondary | ICD-10-CM | POA: Diagnosis not present

## 2023-04-04 DIAGNOSIS — Z510 Encounter for antineoplastic radiation therapy: Secondary | ICD-10-CM | POA: Diagnosis not present

## 2023-04-04 DIAGNOSIS — Z191 Hormone sensitive malignancy status: Secondary | ICD-10-CM | POA: Diagnosis not present

## 2023-04-04 LAB — RAD ONC ARIA SESSION SUMMARY
Course Elapsed Days: 38
Plan Fractions Treated to Date: 26
Plan Prescribed Dose Per Fraction: 2.5 Gy
Plan Total Fractions Prescribed: 28
Plan Total Prescribed Dose: 70 Gy
Reference Point Dosage Given to Date: 65 Gy
Reference Point Session Dosage Given: 2.5 Gy
Session Number: 26

## 2023-04-07 ENCOUNTER — Ambulatory Visit: Payer: Medicare PPO

## 2023-04-07 ENCOUNTER — Other Ambulatory Visit: Payer: Self-pay

## 2023-04-07 ENCOUNTER — Ambulatory Visit
Admission: RE | Admit: 2023-04-07 | Discharge: 2023-04-07 | Disposition: A | Discharge status: Home / Self Care | Payer: Medicare PPO | Source: Ambulatory Visit | Attending: Radiation Oncology | Admitting: Radiation Oncology

## 2023-04-07 DIAGNOSIS — Z191 Hormone sensitive malignancy status: Secondary | ICD-10-CM | POA: Diagnosis not present

## 2023-04-07 DIAGNOSIS — C61 Malignant neoplasm of prostate: Secondary | ICD-10-CM | POA: Diagnosis not present

## 2023-04-07 DIAGNOSIS — Z510 Encounter for antineoplastic radiation therapy: Secondary | ICD-10-CM | POA: Diagnosis not present

## 2023-04-07 LAB — RAD ONC ARIA SESSION SUMMARY
Course Elapsed Days: 41
Plan Fractions Treated to Date: 27
Plan Prescribed Dose Per Fraction: 2.5 Gy
Plan Total Fractions Prescribed: 28
Plan Total Prescribed Dose: 70 Gy
Reference Point Dosage Given to Date: 67.5 Gy
Reference Point Session Dosage Given: 2.5 Gy
Session Number: 27

## 2023-04-08 ENCOUNTER — Other Ambulatory Visit: Payer: Self-pay

## 2023-04-08 ENCOUNTER — Ambulatory Visit
Admission: RE | Admit: 2023-04-08 | Discharge: 2023-04-08 | Disposition: A | Discharge status: Home / Self Care | Payer: Medicare PPO | Source: Ambulatory Visit | Attending: Radiation Oncology | Admitting: Radiation Oncology

## 2023-04-08 DIAGNOSIS — Z510 Encounter for antineoplastic radiation therapy: Secondary | ICD-10-CM | POA: Diagnosis not present

## 2023-04-08 DIAGNOSIS — Z191 Hormone sensitive malignancy status: Secondary | ICD-10-CM | POA: Diagnosis not present

## 2023-04-08 DIAGNOSIS — C61 Malignant neoplasm of prostate: Secondary | ICD-10-CM | POA: Diagnosis not present

## 2023-04-08 LAB — RAD ONC ARIA SESSION SUMMARY
Course Elapsed Days: 42
Plan Fractions Treated to Date: 28
Plan Prescribed Dose Per Fraction: 2.5 Gy
Plan Total Fractions Prescribed: 28
Plan Total Prescribed Dose: 70 Gy
Reference Point Dosage Given to Date: 70 Gy
Reference Point Session Dosage Given: 2.5 Gy
Session Number: 28

## 2023-04-09 NOTE — Radiation Completion Notes (Addendum)
  Radiation Oncology         (336) [phone] ________________________________  Name: Cameron Huerta MRN: [id number]  Date: 04/08/2023  DOB: [birth year]  Referring Physician: Marcine Matar, M.D. Date of Service: 2023-04-09 Radiation Oncologist: Margaretmary Bayley, M.D. [facility] [place]     End of Treatment Note     Diagnosis: 67 y.o. gentleman with Stage T1c adenocarcinoma of the prostate with Gleason Score of 3+4, and PSA of 7.28.   Intent: Curative     ==========DELIVERED PLANS==========  First Treatment Date: 2023-02-25 - Last Treatment Date: 2023-04-08   Plan Name: Prostate Site: Prostate Technique: IMRT Mode: Photon Dose Per Fraction: 2.5 Gy Prescribed Dose (Delivered / Prescribed): 70 Gy / 70 Gy Prescribed Fxs (Delivered / Prescribed): 28 / 28     ==========ON TREATMENT VISIT DATES========== 2023-02-28, 2023-03-06, 2023-03-14, 2023-03-21, 2023-03-27, 2023-04-04     See weekly On Treatment Notes in Epic for details.  He tolerated the treatments well with only minimal urinary symptoms and modest fatigue.  He did report some mild increased nocturia.  The patient will receive a call in about one month from the radiation oncology department. He will continue follow up with his urologist, Dr. Retta Diones as well.  ------------------------------------------------   Margaretmary Dys, MD [facility]  Radiation Oncology Direct Dial: [phone]  Fax: [phone] [email or web address]  Skype  LinkedIn

## 2023-04-24 NOTE — Progress Notes (Signed)
Patient was a RadOnc Consult on 10/28/22 for his stage T1c adenocarcinoma of the prostate with Gleason score of 3+4, and PSA of 7.28. Patient proceed with treatment recommendations of 5.5 weeks of external radiation and had his final radiation treatment on 04/08/23.   Patient is scheduled for follow up with urology on 07/11/23 with Dr. Alvester Morin followed by PSA check. RN placed request to have post treatment nurse call scheduled.  Left message with patient to review appointment information and provide education on post treatment PSA monitoring.

## 2023-04-28 NOTE — Progress Notes (Signed)
RN spoke with patient and provided post treatment education on PSA monitoring.  RN confirmed with patient appointment with urology on 07/11/23 @ 8:30am.  No additional needs at this time.

## 2023-04-29 ENCOUNTER — Other Ambulatory Visit: Payer: Self-pay | Admitting: Urology

## 2023-04-29 DIAGNOSIS — C61 Malignant neoplasm of prostate: Secondary | ICD-10-CM

## 2023-05-05 NOTE — Progress Notes (Signed)
  Radiation Oncology         (336) [phone] ________________________________  Name: Cameron Huerta MRN: [id number]  Date of Service: 05/06/2023  DOB: 03-Sep-1956  Post Treatment Telephone Note  Diagnosis:  Stage T1c adenocarcinoma of the prostate with Gleason Score of 3+4, and PSA of 7.28. (as documented in provider EOT note)   Pre Treatment IPSS Score: 1 (as documented in the provider consult note)   The patient was available for call today.   Symptoms of fatigue have improved since completing therapy.  Symptoms of bladder changes have improved since completing therapy. Current symptoms include none, and medications for bladder symptoms include none.  Symptoms of bowel changes have improved since completing therapy. Current symptoms include none, and medications for bowel symptoms include none.     Post Treatment IPSS Score: IPSS Questionnaire (AUA-7): Over the past month.   1)  How often have you had a sensation of not emptying your bladder completely after you finish urinating?  0 - Not at all  2)  How often have you had to urinate again less than two hours after you finished urinating? 0 - Not at all  3)  How often have you found you stopped and started again several times when you urinated?  0 - Not at all  4) How difficult have you found it to postpone urination?  0 - Not at all  5) How often have you had a weak urinary stream?  1 - Less than 1 time in 5  6) How often have you had to push or strain to begin urination?  0 - Not at all  7) How many times did you most typically get up to urinate from the time you went to bed until the time you got up in the morning?  1 - 1 time  Total score:  2. Which indicates mild symptoms  0-7 mildly symptomatic   8-19 moderately symptomatic   20-35 severely symptomatic    Patient (has a scheduled follow up visit with his urologist, Dr. Alvester Morin, on 07/07/2023 for ongoing surveillance. He was counseled that PSA levels will be drawn in the urology  office, and was reassured that additional time is expected to improve bowel and bladder symptoms. He was encouraged to call back with concerns or questions regarding radiation.  This concludes the interaction.  Ruel Favors, LPN

## 2023-05-06 ENCOUNTER — Ambulatory Visit
Admission: RE | Admit: 2023-05-06 | Discharge: 2023-05-06 | Disposition: A | Discharge status: Home / Self Care | Payer: Medicare PPO | Source: Ambulatory Visit | Attending: Radiation Oncology | Admitting: Radiation Oncology

## 2023-05-19 ENCOUNTER — Telehealth: Payer: Self-pay | Admitting: *Deleted

## 2023-05-20 ENCOUNTER — Encounter: Payer: Medicare PPO | Admitting: *Deleted

## 2023-05-27 ENCOUNTER — Inpatient Hospital Stay: Payer: Medicare PPO | Attending: Adult Health | Admitting: *Deleted

## 2023-05-27 ENCOUNTER — Encounter: Payer: Self-pay | Admitting: *Deleted

## 2023-05-27 DIAGNOSIS — C61 Malignant neoplasm of prostate: Secondary | ICD-10-CM

## 2023-05-27 NOTE — Progress Notes (Signed)
SCP reviewed and completed. 

## 2023-06-05 ENCOUNTER — Ambulatory Visit (INDEPENDENT_AMBULATORY_CARE_PROVIDER_SITE_OTHER): Payer: Medicare PPO

## 2023-06-05 DIAGNOSIS — I442 Atrioventricular block, complete: Secondary | ICD-10-CM

## 2023-06-05 LAB — CUP PACEART REMOTE DEVICE CHECK
Battery Voltage: 95
Date Time Interrogation Session: 20240822082047
Implantable Lead Connection Status: 753985
Implantable Lead Connection Status: 753985
Implantable Lead Implant Date: 20240219
Implantable Lead Implant Date: 20240219
Implantable Lead Location: 753859
Implantable Lead Location: 753860
Implantable Lead Model: 377
Implantable Lead Model: 377
Implantable Lead Serial Number: 8001238269
Implantable Lead Serial Number: 8001307092
Implantable Pulse Generator Implant Date: 20240219
Pulse Gen Model: 407145
Pulse Gen Serial Number: 1000156359

## 2023-06-12 DIAGNOSIS — Z0000 Encounter for general adult medical examination without abnormal findings: Secondary | ICD-10-CM | POA: Diagnosis not present

## 2023-06-12 DIAGNOSIS — I712 Thoracic aortic aneurysm, without rupture, unspecified: Secondary | ICD-10-CM | POA: Diagnosis not present

## 2023-06-12 DIAGNOSIS — D6869 Other thrombophilia: Secondary | ICD-10-CM | POA: Diagnosis not present

## 2023-06-12 DIAGNOSIS — C61 Malignant neoplasm of prostate: Secondary | ICD-10-CM | POA: Diagnosis not present

## 2023-06-12 DIAGNOSIS — I442 Atrioventricular block, complete: Secondary | ICD-10-CM | POA: Diagnosis not present

## 2023-06-12 DIAGNOSIS — E7800 Pure hypercholesterolemia, unspecified: Secondary | ICD-10-CM | POA: Diagnosis not present

## 2023-06-12 DIAGNOSIS — I10 Essential (primary) hypertension: Secondary | ICD-10-CM | POA: Diagnosis not present

## 2023-06-17 NOTE — Progress Notes (Signed)
Remote pacemaker transmission.   

## 2023-07-11 DIAGNOSIS — C61 Malignant neoplasm of prostate: Secondary | ICD-10-CM | POA: Diagnosis not present

## 2023-09-04 ENCOUNTER — Ambulatory Visit (INDEPENDENT_AMBULATORY_CARE_PROVIDER_SITE_OTHER): Payer: Medicare PPO

## 2023-09-04 DIAGNOSIS — I442 Atrioventricular block, complete: Secondary | ICD-10-CM | POA: Diagnosis not present

## 2023-09-04 LAB — CUP PACEART REMOTE DEVICE CHECK
Battery Remaining Percentage: 90 %
Brady Statistic RA Percent Paced: 70 %
Brady Statistic RV Percent Paced: 100 %
Date Time Interrogation Session: 20241121090235
Implantable Lead Connection Status: 753985
Implantable Lead Connection Status: 753985
Implantable Lead Implant Date: 20240219
Implantable Lead Implant Date: 20240219
Implantable Lead Location: 753859
Implantable Lead Location: 753860
Implantable Lead Model: 377
Implantable Lead Model: 377
Implantable Lead Serial Number: 8001238269
Implantable Lead Serial Number: 8001307092
Implantable Pulse Generator Implant Date: 20240219
Lead Channel Impedance Value: 585 Ohm
Lead Channel Impedance Value: 624 Ohm
Lead Channel Pacing Threshold Amplitude: 0.8 V
Lead Channel Pacing Threshold Amplitude: 1.6 V
Lead Channel Pacing Threshold Pulse Width: 0.4 ms
Lead Channel Pacing Threshold Pulse Width: 0.4 ms
Lead Channel Sensing Intrinsic Amplitude: 3.8 mV
Lead Channel Sensing Intrinsic Amplitude: 7.8 mV
Pulse Gen Model: 407145
Pulse Gen Serial Number: 1000156359

## 2023-09-29 NOTE — Progress Notes (Signed)
Remote pacemaker transmission.   

## 2023-10-14 DIAGNOSIS — Z23 Encounter for immunization: Secondary | ICD-10-CM | POA: Diagnosis not present

## 2023-12-04 ENCOUNTER — Ambulatory Visit: Payer: Medicare PPO

## 2023-12-05 ENCOUNTER — Encounter: Payer: Self-pay | Admitting: Internal Medicine

## 2023-12-05 LAB — CUP PACEART REMOTE DEVICE CHECK
Date Time Interrogation Session: 20250220095412
Implantable Lead Connection Status: 753985
Implantable Lead Connection Status: 753985
Implantable Lead Implant Date: 20240219
Implantable Lead Implant Date: 20240219
Implantable Lead Location: 753859
Implantable Lead Location: 753860
Implantable Lead Model: 377
Implantable Lead Model: 377
Implantable Lead Serial Number: 8001238269
Implantable Lead Serial Number: 8001307092
Implantable Pulse Generator Implant Date: 20240219
Pulse Gen Model: 407145
Pulse Gen Serial Number: 1000156359

## 2024-01-12 DIAGNOSIS — C61 Malignant neoplasm of prostate: Secondary | ICD-10-CM | POA: Diagnosis not present

## 2024-01-19 DIAGNOSIS — N5201 Erectile dysfunction due to arterial insufficiency: Secondary | ICD-10-CM | POA: Diagnosis not present

## 2024-01-19 DIAGNOSIS — R3121 Asymptomatic microscopic hematuria: Secondary | ICD-10-CM | POA: Diagnosis not present

## 2024-01-19 DIAGNOSIS — C61 Malignant neoplasm of prostate: Secondary | ICD-10-CM | POA: Diagnosis not present

## 2024-03-01 DIAGNOSIS — Z23 Encounter for immunization: Secondary | ICD-10-CM | POA: Diagnosis not present

## 2024-03-04 ENCOUNTER — Ambulatory Visit (INDEPENDENT_AMBULATORY_CARE_PROVIDER_SITE_OTHER): Payer: Medicare PPO

## 2024-03-04 DIAGNOSIS — I442 Atrioventricular block, complete: Secondary | ICD-10-CM | POA: Diagnosis not present

## 2024-03-04 LAB — CUP PACEART REMOTE DEVICE CHECK
Date Time Interrogation Session: 20250522111941
Implantable Lead Connection Status: 753985
Implantable Lead Connection Status: 753985
Implantable Lead Implant Date: 20240219
Implantable Lead Implant Date: 20240219
Implantable Lead Location: 753859
Implantable Lead Location: 753860
Implantable Lead Model: 377
Implantable Lead Model: 377
Implantable Lead Serial Number: 8001238269
Implantable Lead Serial Number: 8001307092
Implantable Pulse Generator Implant Date: 20240219
Pulse Gen Model: 407145
Pulse Gen Serial Number: 1000156359

## 2024-03-07 ENCOUNTER — Ambulatory Visit: Payer: Self-pay | Admitting: Internal Medicine

## 2024-03-13 DIAGNOSIS — E7800 Pure hypercholesterolemia, unspecified: Secondary | ICD-10-CM | POA: Diagnosis not present

## 2024-03-13 DIAGNOSIS — I10 Essential (primary) hypertension: Secondary | ICD-10-CM | POA: Diagnosis not present

## 2024-03-13 DIAGNOSIS — C61 Malignant neoplasm of prostate: Secondary | ICD-10-CM | POA: Diagnosis not present

## 2024-04-21 NOTE — Progress Notes (Signed)
 Remote pacemaker transmission.

## 2024-06-03 ENCOUNTER — Ambulatory Visit (INDEPENDENT_AMBULATORY_CARE_PROVIDER_SITE_OTHER): Payer: Medicare PPO

## 2024-06-03 DIAGNOSIS — I442 Atrioventricular block, complete: Secondary | ICD-10-CM

## 2024-06-07 LAB — CUP PACEART REMOTE DEVICE CHECK
Battery Voltage: 85
Date Time Interrogation Session: 20250821105852
Implantable Lead Connection Status: 753985
Implantable Lead Connection Status: 753985
Implantable Lead Implant Date: 20240219
Implantable Lead Implant Date: 20240219
Implantable Lead Location: 753859
Implantable Lead Location: 753860
Implantable Lead Model: 377
Implantable Lead Model: 377
Implantable Lead Serial Number: 8001238269
Implantable Lead Serial Number: 8001307092
Implantable Pulse Generator Implant Date: 20240219
Pulse Gen Model: 407145
Pulse Gen Serial Number: 1000156359

## 2024-06-09 ENCOUNTER — Ambulatory Visit: Payer: Self-pay | Admitting: Internal Medicine

## 2024-07-07 NOTE — Progress Notes (Signed)
 Remote PPM Transmission

## 2024-07-13 DIAGNOSIS — C61 Malignant neoplasm of prostate: Secondary | ICD-10-CM | POA: Diagnosis not present

## 2024-07-22 DIAGNOSIS — R399 Unspecified symptoms and signs involving the genitourinary system: Secondary | ICD-10-CM | POA: Diagnosis not present

## 2024-07-22 DIAGNOSIS — C61 Malignant neoplasm of prostate: Secondary | ICD-10-CM | POA: Diagnosis not present

## 2024-07-22 DIAGNOSIS — N5201 Erectile dysfunction due to arterial insufficiency: Secondary | ICD-10-CM | POA: Diagnosis not present

## 2024-08-13 DIAGNOSIS — Z23 Encounter for immunization: Secondary | ICD-10-CM | POA: Diagnosis not present

## 2024-08-25 DIAGNOSIS — L03031 Cellulitis of right toe: Secondary | ICD-10-CM | POA: Diagnosis not present

## 2024-09-02 ENCOUNTER — Ambulatory Visit: Payer: Medicare PPO

## 2024-09-02 DIAGNOSIS — I442 Atrioventricular block, complete: Secondary | ICD-10-CM

## 2024-09-03 LAB — CUP PACEART REMOTE DEVICE CHECK
Battery Voltage: 85
Date Time Interrogation Session: 20251120081759
Implantable Lead Connection Status: 753985
Implantable Lead Connection Status: 753985
Implantable Lead Implant Date: 20240219
Implantable Lead Implant Date: 20240219
Implantable Lead Location: 753859
Implantable Lead Location: 753860
Implantable Lead Model: 377
Implantable Lead Model: 377
Implantable Lead Serial Number: 8001238269
Implantable Lead Serial Number: 8001307092
Implantable Pulse Generator Implant Date: 20240219
Pulse Gen Model: 407145
Pulse Gen Serial Number: 1000156359

## 2024-09-05 ENCOUNTER — Ambulatory Visit: Payer: Self-pay | Admitting: Internal Medicine

## 2024-09-06 NOTE — Progress Notes (Signed)
 Remote PPM Transmission
